# Patient Record
Sex: Female | Born: 1990
Health system: Southern US, Community
[De-identification: ages and names within clinical notes are randomized; demographics above are authoritative.]

## PROBLEM LIST (undated history)

## (undated) ENCOUNTER — Inpatient Hospital Stay (HOSPITAL_COMMUNITY): Payer: Self-pay

## (undated) DIAGNOSIS — R51 Headache: Secondary | ICD-10-CM

## (undated) DIAGNOSIS — E079 Disorder of thyroid, unspecified: Secondary | ICD-10-CM

## (undated) DIAGNOSIS — E0590 Thyrotoxicosis, unspecified without thyrotoxic crisis or storm: Secondary | ICD-10-CM

## (undated) DIAGNOSIS — K219 Gastro-esophageal reflux disease without esophagitis: Secondary | ICD-10-CM

## (undated) DIAGNOSIS — F4310 Post-traumatic stress disorder, unspecified: Secondary | ICD-10-CM

## (undated) DIAGNOSIS — Z8751 Personal history of pre-term labor: Secondary | ICD-10-CM

## (undated) DIAGNOSIS — F902 Attention-deficit hyperactivity disorder, combined type: Secondary | ICD-10-CM

## (undated) DIAGNOSIS — R87629 Unspecified abnormal cytological findings in specimens from vagina: Secondary | ICD-10-CM

## (undated) DIAGNOSIS — F3342 Major depressive disorder, recurrent, in full remission: Secondary | ICD-10-CM

## (undated) DIAGNOSIS — R519 Headache, unspecified: Secondary | ICD-10-CM

## (undated) DIAGNOSIS — R7303 Prediabetes: Secondary | ICD-10-CM

## (undated) DIAGNOSIS — F419 Anxiety disorder, unspecified: Secondary | ICD-10-CM

## (undated) HISTORY — DX: Post-traumatic stress disorder, unspecified: F43.10

## (undated) HISTORY — DX: Gastro-esophageal reflux disease without esophagitis: K21.9

## (undated) HISTORY — DX: Personal history of pre-term labor: Z87.51

## (undated) HISTORY — DX: Attention-deficit hyperactivity disorder, combined type: F90.2

## (undated) HISTORY — DX: Anxiety disorder, unspecified: F41.9

## (undated) HISTORY — DX: Thyrotoxicosis, unspecified without thyrotoxic crisis or storm: E05.90

## (undated) HISTORY — DX: Unspecified abnormal cytological findings in specimens from vagina: R87.629

## (undated) HISTORY — DX: Major depressive disorder, recurrent, in full remission: F33.42

## (undated) HISTORY — DX: Prediabetes: R73.03

---
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
## 2014-09-05 ENCOUNTER — Inpatient Hospital Stay (HOSPITAL_COMMUNITY)
Admission: AD | Admit: 2014-09-05 | Discharge: 2014-09-06 | Disposition: A | Discharge status: Home / Self Care | Payer: BLUE CROSS/BLUE SHIELD | Source: Ambulatory Visit | Attending: Obstetrics & Gynecology | Admitting: Obstetrics & Gynecology

## 2014-09-05 ENCOUNTER — Encounter (HOSPITAL_COMMUNITY): Payer: Self-pay | Admitting: *Deleted

## 2014-09-05 DIAGNOSIS — R1030 Lower abdominal pain, unspecified: Secondary | ICD-10-CM | POA: Insufficient documentation

## 2014-09-05 DIAGNOSIS — O9989 Other specified diseases and conditions complicating pregnancy, childbirth and the puerperium: Secondary | ICD-10-CM | POA: Insufficient documentation

## 2014-09-05 DIAGNOSIS — Z8751 Personal history of pre-term labor: Secondary | ICD-10-CM | POA: Insufficient documentation

## 2014-09-05 DIAGNOSIS — N949 Unspecified condition associated with female genital organs and menstrual cycle: Secondary | ICD-10-CM | POA: Insufficient documentation

## 2014-09-05 DIAGNOSIS — Z3A22 22 weeks gestation of pregnancy: Secondary | ICD-10-CM | POA: Insufficient documentation

## 2014-09-05 NOTE — MAU Note (Signed)
Having some vaginal pain and pressure, took tylenol but it did not seem to help, pain started this morning.  Denies LOF/VB.

## 2014-09-05 NOTE — MAU Provider Note (Signed)
Chief Complaint:  Vaginal Pain  HPI: Paula Huff is a 24 y.o. G2P0101 at 1726w3d who presents to maternity admissions reporting lower abdominal/pelvic pain radiating to vagina.  Pain is bilateral lower abdominal radiating into vagina, sharp, moderate, waxing/waning worsened by walking, improved by sitting/laying down. First noticed about 5 days ago, responsive to tylenol but returns in several hours after dosing.   Denies contractions, leakage of fluid or vaginal bleeding. Good fetal movement.   Pregnancy Course: Uncomplicated, [facility]. History of PTL, SVD at 27 weeks to first child in 2009. Receiving 17-P injections now.  Past Medical History: Non-contributory Past obstetric history: # Outcome Date GA Lbr Len/2nd Weight Sex Delivery Anes PTL Lv  2 Current           1 Preterm 2009    M Vag-Spont   Y   Past Surgical History: No past surgeries Family History: Non-contributory Social History: Denies smoking, EtOH, illicit substances Allergies: No Known Allergies Meds:  Prescriptions prior to admission  Medication Sig Dispense Refill Last Dose  . acetaminophen (TYLENOL) 500 MG tablet Take 1,000 mg by mouth every 6 (six) hours as needed for mild pain.   09/05/2014 at Unknown time  . hydroxyprogesterone caproate (MAKENA) 250 mg/mL OIL injection Inject 250 mg into the muscle once a week. Patient receives injection ever Wednesday.   Past Week at Unknown time  . Prenatal Vit-Fe Fumarate-FA (PRENATAL MULTIVITAMIN) TABS tablet Take 1 tablet by mouth daily at 12 noon.   09/05/2014 at Unknown time   ROS: In addition to HPI: Constitutional: Negative for fever and chills Respiratory: Negative for dyspnea Cardiovascular: Negative for chest pain or palpitations  Gastrointestinal: Negative for vomiting, diarrhea and constipation Genitourinary: Negative for dysuria and urgency Neurological: Negative for headaches and visual changes  Physical Exam: Blood pressure 132/65,  pulse 90, temperature 98.4 F (36.9 C), temperature source Oral, resp. rate 18, height 5' 5.5" (1.664 m), weight 84.913 kg (187 lb 3.2 oz). GENERAL: Well-appearing, well-nourished female in no distress.  HEENT: Normocephalic, atraumatic HEART: Normal rate RESP: Normal effort ABDOMEN: Soft, non-tender, gravid uterus consistent with dates. EXTREMITIES: No edema NEURO: alert and oriented, DTRs 2+  SPECULUM: NEFG, physiologic discharge, no blood, cervix clean Dilation: 1 Effacement (%): 10 Cervical Position: Posterior Exam by:: Dr. Loreta AveAcosta FHR: 165   Imaging:  No results found. ED Course Cervix 1cm dilated and long on check, no signs of PTL. Will obtain U/S to verify cervical length. History consistent with round ligament pain.  ---> Cervical length 3.2cm on U/S  Assessment: Round ligament pain History of preterm labor without current contractions or cervical shortening  Plan: Discharge home Labor precautions and fetal kick counts    Medication List    TAKE these medications        acetaminophen 500 MG tablet  Commonly known as:  TYLENOL  Take 1,000 mg by mouth every 6 (six) hours as needed for mild pain.     MAKENA 250 mg/mL Oil injection  Generic drug:  hydroxyprogesterone caproate  Inject 250 mg into the muscle once a week. Patient receives injection ever Wednesday.     prenatal multivitamin Tabs tablet  Take 1 tablet by mouth daily at 12 noon.       Ryan B. Jarvis NewcomerGrunz, MD, PGY-2 09/06/2014 2:04 AM  OB fellow attestation:  I have seen and examined this patient; I agree with above documentation in the resident's note.   Paula Huff is a 24 y.o. G2P0101 reporting lower  abdominal pain, vaginal pain for >1 month, now worsening for past 5 days.  +FM, denies LOF, VB, contractions, vaginal discharge.  PE: BP 132/65 mmHg  Pulse 90  Temp(Src) 98.4 F (36.9 C) (Oral)  Resp 18  Ht 5' 5.5" (1.664 m)  Wt 187 lb 3.2 oz (84.913 kg)  BMI 30.67 kg/m2 Gen: calm comfortable,  NAD Resp: normal effort, no distress Abd: gravid Dilation: 1 Effacement (%): 10 Cervical Position: Posterior Exam by:: Dr. Loreta Ave Very firm, posterior  ROS, labs, PMH reviewed  Plan: round ligament - preterm labor precautions - continue routine follow up in OB clinic - CL 3.2cm via transvaginal sono, cervix does not feel labored and patient's history not consistent with preterm labor - tylenol prn, belly band prn, warm compresses  Annaclaire Walsworth ROCIO, MD 3:33 AM

## 2014-09-06 ENCOUNTER — Inpatient Hospital Stay (HOSPITAL_COMMUNITY): Payer: BLUE CROSS/BLUE SHIELD

## 2014-09-06 DIAGNOSIS — O9989 Other specified diseases and conditions complicating pregnancy, childbirth and the puerperium: Secondary | ICD-10-CM | POA: Diagnosis not present

## 2014-09-06 DIAGNOSIS — Z8751 Personal history of pre-term labor: Secondary | ICD-10-CM | POA: Insufficient documentation

## 2014-09-06 DIAGNOSIS — R1030 Lower abdominal pain, unspecified: Secondary | ICD-10-CM | POA: Diagnosis not present

## 2014-09-06 DIAGNOSIS — N949 Unspecified condition associated with female genital organs and menstrual cycle: Secondary | ICD-10-CM | POA: Insufficient documentation

## 2014-09-06 DIAGNOSIS — Z3A22 22 weeks gestation of pregnancy: Secondary | ICD-10-CM | POA: Insufficient documentation

## 2014-09-06 NOTE — Discharge Instructions (Signed)
Your pain is likely due to round ligament stretching. Tylenol should be able to take the edge off this pain, but you can also try a supportive belt when walking. You are not in preterm labor and your cervix is 3.2cm long.   If you have regular contractions that are 3-5 minutes apart for at least one hour, bleeding or fluid from your vagina, or you do not feel the baby moving at least 10 times every 2 hours, please call your OB and/or [facility].

## 2015-07-11 ENCOUNTER — Encounter (HOSPITAL_COMMUNITY): Payer: Self-pay | Admitting: *Deleted

## 2016-01-11 MED FILL — MONO-LINYAH 28 TABLET: 0.25-35 | 28 days supply | Qty: 28 | Fill #0

## 2016-02-07 MED FILL — MONO-LINYAH 28 TABLET: 0.25-35 | 28 days supply | Qty: 28 | Fill #1

## 2016-03-06 MED FILL — MONO-LINYAH 28 TABLET: 0.25-35 | 28 days supply | Qty: 28 | Fill #2

## 2016-04-03 MED FILL — LARIN 24 FE 1 MG-20 MCG TAB: 1-20 | 28 days supply | Qty: 28 | Fill #0

## 2016-05-06 MED FILL — LARIN 24 FE 1 MG-20 MCG TAB: 1-20 | 28 days supply | Qty: 28 | Fill #1

## 2016-06-03 MED FILL — LARIN 24 FE 1 MG-20 MCG TAB: 1-20 | 28 days supply | Qty: 28 | Fill #2

## 2016-07-01 MED FILL — LARIN 24 FE 1 MG-20 MCG TAB: 1-20 | 28 days supply | Qty: 28 | Fill #3

## 2016-07-22 ENCOUNTER — Telehealth: Payer: Self-pay | Admitting: *Deleted

## 2016-07-22 NOTE — Telephone Encounter (Signed)
Unable to reach patient at time of Pre-Visit Call.  Left message for patient to return call when available.    

## 2016-07-23 ENCOUNTER — Ambulatory Visit (INDEPENDENT_AMBULATORY_CARE_PROVIDER_SITE_OTHER): Payer: Self-pay | Admitting: Family Medicine

## 2016-07-23 ENCOUNTER — Encounter: Payer: Self-pay | Admitting: Family Medicine

## 2016-07-23 VITALS — BP 114/63 | HR 74 | Temp 98.4°F | Ht 65.5 in | Wt 195.0 lb

## 2016-07-23 DIAGNOSIS — E6609 Other obesity due to excess calories: Secondary | ICD-10-CM

## 2016-07-23 DIAGNOSIS — R002 Palpitations: Secondary | ICD-10-CM

## 2016-07-23 DIAGNOSIS — Z6831 Body mass index (BMI) 31.0-31.9, adult: Secondary | ICD-10-CM

## 2016-07-23 LAB — LIPID PANEL
Cholesterol: 114 mg/dL (ref 0–200)
HDL: 54 mg/dL (ref >39.00)
LDL Cholesterol: 51 mg/dL (ref 0–99)
NONHDL: 60.48
TRIGLYCERIDES: 49 mg/dL (ref 0.0–149.0)
Total CHOL/HDL Ratio: 2
VLDL: 9.8 mg/dL (ref 0.0–40.0)

## 2016-07-23 LAB — COMPREHENSIVE METABOLIC PANEL
ALT: 16 U/L (ref 0–35)
AST: 14 U/L (ref 0–37)
Albumin: 4.4 g/dL (ref 3.5–5.2)
Alkaline Phosphatase: 73 U/L (ref 39–117)
BILIRUBIN TOTAL: 0.5 mg/dL (ref 0.2–1.2)
BUN: 9 mg/dL (ref 6–23)
CALCIUM: 9.6 mg/dL (ref 8.4–10.5)
CO2: 25 mEq/L (ref 19–32)
Chloride: 103 mEq/L (ref 96–112)
Creatinine, Ser: 0.82 mg/dL (ref 0.40–1.20)
GFR: 107.14 mL/min (ref >60.00)
Glucose, Bld: 80 mg/dL (ref 70–99)
Potassium: 3.7 mEq/L (ref 3.5–5.1)
Sodium: 138 mEq/L (ref 135–145)
Total Protein: 7.6 g/dL (ref 6.0–8.3)

## 2016-07-23 LAB — MAGNESIUM: MAGNESIUM: 2.1 mg/dL (ref 1.5–2.5)

## 2016-07-23 NOTE — Progress Notes (Signed)
Pre visit review using our clinic review tool, if applicable. No additional management support is needed unless otherwise documented below in the visit note. 

## 2016-07-23 NOTE — Progress Notes (Signed)
Chief Complaint  Patient presents with  . Establish Care    NO concerns noted.       New Patient Visit SUBJECTIVE: HPI: Paula Huff is an 25 y.o.female who is being seen for establishing care.  Palpitations This has been going on for 2 years. Thinks it may be related to stress. Could be every 1-3 days.  It normally lasts for around 2 min. Nothing she notices makes it better or worse No associated symptoms such as light headedness, dizziness, chest pain or SOB.  No correlation with meals or how much she has drank.  Obesity Diet is unhealthy in general. 5 days a week will walk.  No Known Allergies  Past Medical History:  Diagnosis Date  . Medical history non-contributory    Past Surgical History:  Procedure Laterality Date  . NO PAST SURGERIES     Social History   Social History  . Marital status: Single   Social History Main Topics  . Smoking status: Never Smoker  . Smokeless tobacco: Never Used  . Alcohol use No  . Drug use: No  . Sexual activity: Yes    Birth control/ protection: None    Family History  Problem Relation Age of Onset  . Hypertension Mother   . Diabetes Father   . Hypothyroidism Sister   . Diabetes Brother   . Diabetes Maternal Grandmother      Current Outpatient Prescriptions:  .  Norethindrone Acetate-Ethinyl Estrad-FE (LOESTRIN 24 FE) 1-20 MG-MCG(24) tablet, Take 1 tablet by mouth daily., Disp: , Rfl:   Patient's last menstrual period was 07/22/2016 (exact date).  ROS Cardiovascular: Denies chest pain  Respiratory: Denies dyspnea   OBJECTIVE: BP 114/63 (BP Location: Right Arm, Patient Position: Sitting, Cuff Size: Large)   Pulse 74   Temp 98.4 F (36.9 C) (Oral)   Ht 5' 5.5" (1.664 m)   Wt 195 lb (88.5 kg)   LMP 07/22/2016 (Exact Date)   SpO2 100% Comment: RA  BMI 31.96 kg/m   Constitutional: -  VS reviewed -  Well developed, well nourished, appears stated age -  No apparent distress  Psychiatric: -  Oriented to  person, place, and time -  Memory intact -  Affect and mood normal -  Fluent conversation, good eye contact -  Judgment and insight age appropriate  Eye: -  Conjunctivae clear, no discharge -  Pupils symmetric, round, reactive to light  ENMT: -  Oral mucosa without lesions, tongue and uvula midline    Tonsils not enlarged, no erythema, no exudate, trachea midline    Pharynx moist, no lesions, no erythema  Neck: -  No gross swelling, no palpable masses -  Thyroid midline, not enlarged, mobile, no palpable masses  Cardiovascular: -  RRR, no murmurs -  No LE edema -  No bruits  Respiratory: -  Normal respiratory effort, no accessory muscle use, no retraction -  Breath sounds equal, no wheezes, no ronchi, no crackles  Gastrointestinal: -  Bowel sounds normal -  No tenderness, no distention, no guarding, no masses  Skin: -  No significant lesion on inspection -  Warm and dry to palpation   ASSESSMENT/PLAN: Class 1 obesity due to excess calories without serious comorbidity with body mass index (BMI) of 31.0 to 31.9 in adult - Plan: Comprehensive metabolic panel, Lipid panel  Palpitations - Plan: Magnesium, Comprehensive metabolic panel  Orders as above. Will r/o metabolic causes of palpitations. Could do Holter if it is still bothering her. She does  not wish to be treated for anxiety at this time. Patient should return pending the above workup. The patient voiced understanding and agreement to the plan.   Jilda Rocheicholas Paul AftonWendling, DO 07/23/16  2:28 PM

## 2016-07-31 ENCOUNTER — Telehealth: Payer: Self-pay | Admitting: Family Medicine

## 2016-07-31 NOTE — Telephone Encounter (Signed)
Relation to RU:EAVWpt:self Call back [phone]   Reason for call:  Patient returning call best # [phone] work

## 2016-08-07 MED FILL — LARIN 24 FE 1 MG-20 MCG TAB: 1-20 | 28 days supply | Qty: 28 | Fill #4

## 2016-09-01 NOTE — L&D Delivery Note (Signed)
Delivery Note At 6:29 PM a viable female was delivered via  (Presentation: ROA,). Compound hand presentation, posterior arm delivered with ease and body easily after.  APGAR: 8, 9; weight pending.   Placenta status: spontaneous, intact.  Cord: 3 vessels.  Anesthesia:  epidural Episiotomy:  n/a Lacerations: periurethral abrasions, hemostatic Suture Repair: n/a Est. Blood Loss (mL):    Mom to postpartum.  Baby to Couplet care / Skin to Skin.  Rolm BookbinderCaroline M Timberly Huff CNM 07/16/2017, 6:43 PM  Please schedule this patient for PP visit in: 4 weeks High risk pregnancy complicated by: previous preterm deliveries Delivery mode:  SVD Anticipated Birth Control:  BTL done PP PP Procedures needed: Incision check  Schedule Integrated BH visit: no Provider: Any provider

## 2016-09-03 MED FILL — LARIN 24 FE 1 MG-20 MCG TAB: 1-20 | 28 days supply | Qty: 28 | Fill #5

## 2016-09-11 ENCOUNTER — Encounter: Payer: Self-pay | Admitting: Family Medicine

## 2016-09-12 ENCOUNTER — Other Ambulatory Visit: Payer: Self-pay | Admitting: Family Medicine

## 2016-09-12 DIAGNOSIS — E6609 Other obesity due to excess calories: Secondary | ICD-10-CM

## 2016-09-20 ENCOUNTER — Encounter (INDEPENDENT_AMBULATORY_CARE_PROVIDER_SITE_OTHER): Payer: Self-pay | Admitting: Family Medicine

## 2016-09-26 ENCOUNTER — Ambulatory Visit (INDEPENDENT_AMBULATORY_CARE_PROVIDER_SITE_OTHER): Payer: 59

## 2016-09-26 ENCOUNTER — Ambulatory Visit (INDEPENDENT_AMBULATORY_CARE_PROVIDER_SITE_OTHER): Payer: 59 | Admitting: Family Medicine

## 2016-09-26 ENCOUNTER — Encounter: Payer: Self-pay | Admitting: Family Medicine

## 2016-09-26 VITALS — BP 110/69 | HR 71 | Resp 16 | Wt 196.7 lb

## 2016-09-26 DIAGNOSIS — M79674 Pain in right toe(s): Secondary | ICD-10-CM

## 2016-09-26 DIAGNOSIS — M79671 Pain in right foot: Secondary | ICD-10-CM | POA: Diagnosis not present

## 2016-09-26 NOTE — Progress Notes (Signed)
   Olivia Mackievonia Rivadeneira is a 26 y.o. female who presents to [facility] today for right foot pain. Patient injured her right foot about a week ago. Her daughter ran over her foot on a however board. She notes pain and swelling and tenderness to the lateral foot at the fifth MTP. She notes some pain with ambulation. No fevers or chills nausea vomiting or diarrhea. She feels well otherwise.   Past Medical History:  Diagnosis Date  . Medical history non-contributory    Past Surgical History:  Procedure Laterality Date  . NO PAST SURGERIES     Social History  Substance Use Topics  . Smoking status: Never Smoker  . Smokeless tobacco: Never Used  . Alcohol use No     ROS:  As above   Medications: Current Outpatient Prescriptions  Medication Sig Dispense Refill  . Norethindrone Acetate-Ethinyl Estrad-FE (LOESTRIN 24 FE) 1-20 MG-MCG(24) tablet Take 1 tablet by mouth daily.     No current facility-administered medications for this visit.    No Known Allergies   Exam:  BP 110/69   Pulse 71   Resp 16   Wt 196 lb 11.2 oz (89.2 kg)   BMI 32.23 kg/m  General: Well Developed, well nourished, and in no acute distress.  Neuro/Psych: Alert and oriented x3, extra-ocular muscles intact, able to move all 4 extremities, sensation grossly intact. Skin: Warm and dry, no rashes noted.  Respiratory: Not using accessory muscles, speaking in full sentences, trachea midline.  Cardiovascular: Pulses palpable, no extremity edema. Abdomen: Does not appear distended. MSK: Right foot slightly swollen and tender at the distal fifth metatarsal and MTP. Pain with foot motion. Intact sensation capillary refill.  X-ray right side is unremarkable with no acute fracture seen. Awaiting formal radiology review  No results found for this or any previous visit (from the past 48 hour(s)). No results found.    Assessment and Plan: 26 y.o. female with contusion right  foot no obvious fractures. Plan for relative rest buddy tape as needed ice and topical Aspercreme. Return as needed.    Orders Placed This Encounter  Procedures  . DG Foot Complete Right    Standing Status:   Future    Standing Expiration Date:   11/24/2017    Order Specific Question:   Reason for Exam (SYMPTOM  OR DIAGNOSIS REQUIRED)    Answer:   eval pain rt 5th toe ?fx    Order Specific Question:   Is patient pregnant?    Answer:   No    Order Specific Question:   Preferred imaging location?    Answer:   [facility]     Discussed warning signs or symptoms. Please see discharge instructions. Patient expresses understanding.

## 2016-10-07 ENCOUNTER — Encounter (INDEPENDENT_AMBULATORY_CARE_PROVIDER_SITE_OTHER): Payer: Self-pay | Admitting: Family Medicine

## 2016-10-09 MED FILL — LARIN 24 FE 1 MG-20 MCG TAB: 1-20 | 28 days supply | Qty: 28 | Fill #6

## 2016-10-13 ENCOUNTER — Ambulatory Visit: Payer: 59 | Admitting: Physician Assistant

## 2016-10-14 ENCOUNTER — Ambulatory Visit (INDEPENDENT_AMBULATORY_CARE_PROVIDER_SITE_OTHER): Payer: 59 | Admitting: Family Medicine

## 2016-10-14 ENCOUNTER — Encounter (INDEPENDENT_AMBULATORY_CARE_PROVIDER_SITE_OTHER): Payer: Self-pay | Admitting: Family Medicine

## 2016-10-14 VITALS — BP 124/77 | HR 76 | Temp 98.4°F | Resp 16 | Ht 65.0 in | Wt 191.0 lb

## 2016-10-14 DIAGNOSIS — R7303 Prediabetes: Secondary | ICD-10-CM

## 2016-10-14 DIAGNOSIS — E669 Obesity, unspecified: Secondary | ICD-10-CM | POA: Diagnosis not present

## 2016-10-14 DIAGNOSIS — R5383 Other fatigue: Secondary | ICD-10-CM | POA: Diagnosis not present

## 2016-10-14 DIAGNOSIS — Z6831 Body mass index (BMI) 31.0-31.9, adult: Secondary | ICD-10-CM

## 2016-10-14 DIAGNOSIS — Z9189 Other specified personal risk factors, not elsewhere classified: Secondary | ICD-10-CM

## 2016-10-14 DIAGNOSIS — Z0289 Encounter for other administrative examinations: Secondary | ICD-10-CM

## 2016-10-14 DIAGNOSIS — Z1389 Encounter for screening for other disorder: Secondary | ICD-10-CM

## 2016-10-14 DIAGNOSIS — R0602 Shortness of breath: Secondary | ICD-10-CM

## 2016-10-14 DIAGNOSIS — Z1331 Encounter for screening for depression: Secondary | ICD-10-CM

## 2016-10-14 NOTE — Progress Notes (Signed)
Office: [phone]  /  Fax: [phone]   HPI:   Chief Complaint: Paula  Paula Huff (MR# [id number]) is a 26 y.o. female who presents on 10/14/2016 for Paula evaluation and treatment. Current BMI is Body mass index is 31.78 kg/m.Marland Kitchen Paula Huff has struggled with Paula for years and has been unsuccessful in either losing weight or maintaining long term weight loss. Paula Huff attended our information session and states Paula Huff is currently in the action stage of change and ready to dedicate time achieving and maintaining a healthier weight.  Paula Huff states her family eats meals together Paula Huff thinks her family will eat healthier with  her her desired weight is 150 to 160 Paula Huff started gaining weight around birth of second child her heaviest weight ever was 210 lbs. Paula Huff is a picky eater and doesn't like to eat healthier foods  Paula Huff has significant food cravings issues  Paula Huff snacks frequently in the evenings Paula Huff skips meals frequently Paula Huff is frequently drinking liquids with calories Paula Huff frequently makes poor food choices Paula Huff frequently eats larger portions than normal  Paula Huff has binge eating behaviors Paula Huff struggles with emotional eating    Paula Paula Huff feels her energy is lower than it should be. This has worsened with weight gain and has not worsened recently. Paula Huff denies daytime somnolence and  denies waking up still tired. Patient is at risk for obstructive sleep apnea. Patent has a history of symptoms of morning Paula. Patient generally gets 6 or 7 hours of sleep per night, and states they generally have generally restful sleep. Snoring is present. Apneic episodes are not present. Epworth Sleepiness Score is 3  Paula Huff notes increasing shortness of breath with exercising and seems to be worsening over time with weight gain. Paula Huff notes getting out of breath sooner with activity than Paula Huff used to. This has not gotten worse recently.  Paula Paula Huff has a diagnosis of  prediabetes based on her elevated Hgb A1c in the past, polyphagia and her strong family history of Paula. Paula Huff was informed this puts her at greater risk of developing Paula. Paula Huff is not taking metformin currently and continues to work on diet and exercise to decrease risk of Paula. Paula Huff denies nausea or hypoglycemia.  At risk for Paula Paula Huff is at higher than averagerisk for developing Paula due to her Paula. Paula Huff currently denies polyuria or polydipsia. Paula Huff denies orthopnea.  Paula Screen Paula Huff's Food and Mood (modified PHQ-9) score was  Paula screen PHQ 2/9 10/14/2016  Decreased Interest 2  Down, Depressed, Hopeless 2  PHQ - 2 Score 4  Altered sleeping 3  Tired, decreased energy 2  Change in appetite 3  Feeling bad or failure about yourself  2  Trouble concentrating 3  Moving slowly or fidgety/restless 0  Suicidal thoughts 1  PHQ-9 Score 18    ALLERGIES: No Known Allergies  MEDICATIONS: Current Outpatient Prescriptions on File Prior to Visit  Medication Sig Dispense Refill  . Norethindrone Acetate-Ethinyl Estrad-FE (LOESTRIN 24 FE) 1-20 MG-MCG(24) tablet Take 1 tablet by mouth daily.     No current facility-administered medications on file prior to visit.     PAST MEDICAL HISTORY: Past Medical History:  Diagnosis Date  . Anxiety   . Paula   . GERD (gastroesophageal reflux disease)   . Insomnia   . Medical history non-contributory   . Palpitations   . Prediabetes     PAST SURGICAL HISTORY: Past Surgical History:  Procedure Laterality Date  . NO PAST SURGERIES  SOCIAL HISTORY: Social History  Substance Use Topics  . Smoking status: Never Smoker  . Smokeless tobacco: Never Used  . Alcohol use No    FAMILY HISTORY: Family History  Problem Relation Age of Onset  . Hypertension Mother   . Paula Mother   . Anxiety disorder Mother   . Paula Father   . Paula Father   . Hypothyroidism Sister   . Paula  Brother   . Paula Maternal Grandmother     ROS: Review of Systems  Constitutional: Positive for malaise/Paula.  Respiratory: Positive for shortness of breath (with exercising).   Cardiovascular: Negative for orthopnea.  Gastrointestinal: Negative for nausea.  Genitourinary: Negative for frequency.  Skin:       dryness  Neurological: Positive for headaches.  Endo/Heme/Allergies: Negative for polydipsia.       Polyphagia Negative Hypoglycemia  Psychiatric/Behavioral: Positive for Paula. The patient has insomnia.        Stress    PHYSICAL EXAM: Blood pressure 124/77, pulse 76, temperature 98.4 F (36.9 C), temperature source Oral, resp. rate 16, height 5\' 5"  (1.651 m), weight 191 lb (86.6 kg), last menstrual period 09/03/2016, SpO2 100 %, unknown if currently breastfeeding. Body mass index is 31.78 kg/m. Physical Exam  Constitutional: Paula Huff is oriented to person, place, and time. Paula Huff appears well-developed and well-nourished.  Cardiovascular: Normal rate.   Pulmonary/Chest: Effort normal.  Musculoskeletal: Normal range of motion.  Neurological: Paula Huff is oriented to person, place, and time.  Skin: Skin is warm and dry.  Psychiatric: Paula Huff has a normal mood and affect. Her behavior is normal.  Vitals reviewed.   RECENT LABS AND TESTS: BMET    Component Value Date/Time   NA 138 07/23/2016 1339   K 3.7 07/23/2016 1339   CL 103 07/23/2016 1339   CO2 25 07/23/2016 1339   GLUCOSE 80 07/23/2016 1339   BUN 9 07/23/2016 1339   CREATININE 0.82 07/23/2016 1339   CALCIUM 9.6 07/23/2016 1339   No results found for: HGBA1C No results found for: INSULIN CBC No results found for: WBC, RBC, HGB, HCT, PLT, MCV, MCH, MCHC, RDW, LYMPHSABS, MONOABS, EOSABS, BASOSABS Iron/TIBC/Ferritin/ %Sat No results found for: IRON, TIBC, FERRITIN, IRONPCTSAT Lipid Panel     Component Value Date/Time   CHOL 114 07/23/2016 1339   TRIG 49.0 07/23/2016 1339   HDL 54.00 07/23/2016 1339    CHOLHDL 2 07/23/2016 1339   VLDL 9.8 07/23/2016 1339   LDLCALC 51 07/23/2016 1339   Hepatic Function Panel     Component Value Date/Time   PROT 7.6 07/23/2016 1339   ALBUMIN 4.4 07/23/2016 1339   AST 14 07/23/2016 1339   ALT 16 07/23/2016 1339   ALKPHOS 73 07/23/2016 1339   BILITOT 0.5 07/23/2016 1339   No results found for: TSH  ECG  shows NSR with a rate of 70 BPM INDIRECT CALORIMETER done today shows a VO2 of 266 and a REE of 1849.    ASSESSMENT AND PLAN: Other Paula - Plan: EKG 12-Lead, CBC with Differential/Platelet, Comprehensive metabolic panel, Folate, Lipid Panel With LDL/HDL Ratio, T3, T4, free, TSH, VITAMIN D 25 Hydroxy (Vit-D Deficiency, Fractures), Vitamin B12  Shortness of breath on exertion  Prediabetes - Plan: Hemoglobin A1c, Insulin, random  Paula screening  At risk for Paula mellitus  Class 1 Paula without serious comorbidity with body mass index (BMI) of 31.0 to 31.9 in adult, unspecified Paula type  PLAN:  Paula Huff was informed that her Paula may be related to Paula, Paula  or many other causes. Labs will be ordered, and in the meanwhile Siddhi has agreed to work on diet, exercise and weight loss to help with Paula. Proper sleep hygiene was discussed including the need for 7-8 hours of quality sleep each night. A sleep study was not ordered based on symptoms and Epworth score.  Paula Huff's shortness of breath appears to be Paula related and exercise induced. Paula Huff has agreed to work on weight loss and gradually increase exercise to treat her exercise induced shortness of breath. If Liliah follows our instructions and loses weight without improvement of her shortness of breath, we will plan to refer to pulmonology. We will monitor this condition regularly. Paula Huff agrees to this plan.  Paula Huff will continue to work on weight loss, exercise, and decreasing simple carbohydrates in her diet to help  decrease the risk of Paula. Will check labs and follow in 2 weeks. May need to start metformin to help with polyphagia.   Paula Huff was given extended (at least 15 minutes) Paula prevention counseling today. Paula Huff is 26 y.o. female and has risk factors for Paula including Paula. We discussed intensive lifestyle modifications today with an emphasis on weight loss as well as increasing exercise and decreasing simple carbohydrates in her diet.  Paula Huff had a positive Paula screening. Paula is commonly associated with Paula and often results in emotional eating behaviors. We will monitor this closely and work on CBT to help improve the non-hunger eating patterns. Referral to Psychology may be required if no improvement is seen as Paula Huff continues in our clinic.  Paula Huff is currently in the action stage of change and her goal is to continue with weight loss efforts Paula Huff has agreed to follow the Category 2 plan +100 calories Paula Huff has been instructed to work up to a goal of 150 minutes of combined cardio and strengthening exercise per week for weight loss and overall health benefits. We discussed the following Behavioral Modification Stratagies today: increasing lean protein intake, decreasing simple carbohydrates  and increasing vegetables  Paula Huff has agreed to follow up with our clinic in 2 weeks. Paula Huff was informed of the importance of frequent follow up visits to maximize her success with intensive lifestyle modifications for her multiple health conditions. Paula Huff was informed we would discuss her lab results at her next visit unless there is a critical issue that needs to be addressed sooner. Emberli agreed to keep her next visit at the agreed upon time to discuss these results.  I, Nevada Crane, am acting as scribe for Quillian Quince, MD  I have reviewed the above documentation for accuracy and completeness, and I agree with the above.  -Quillian Quince, MD

## 2016-10-15 LAB — CBC WITH DIFFERENTIAL/PLATELET
Basophils Absolute: 0 10*3/uL (ref 0.0–0.2)
Basos: 0 %
EOS (ABSOLUTE): 0.1 10*3/uL (ref 0.0–0.4)
EOS: 1 %
HEMATOCRIT: 37.7 % (ref 34.0–46.6)
Hemoglobin: 12.8 g/dL (ref 11.1–15.9)
IMMATURE GRANS (ABS): 0 10*3/uL (ref 0.0–0.1)
Immature Granulocytes: 1 %
LYMPHS: 32 %
Lymphocytes Absolute: 1.7 10*3/uL (ref 0.7–3.1)
MCH: 28.1 pg (ref 26.6–33.0)
MCHC: 34 g/dL (ref 31.5–35.7)
MCV: 83 fL (ref 79–97)
Monocytes Absolute: 0.4 10*3/uL (ref 0.1–0.9)
Monocytes: 8 %
NEUTROS ABS: 3.2 10*3/uL (ref 1.4–7.0)
Neutrophils: 58 %
Platelets: 435 10*3/uL — ABNORMAL HIGH (ref 150–379)
RBC: 4.55 x10E6/uL (ref 3.77–5.28)
RDW: 14.5 % (ref 12.3–15.4)
WBC: 5.3 10*3/uL (ref 3.4–10.8)

## 2016-10-15 LAB — COMPREHENSIVE METABOLIC PANEL
ALT: 14 IU/L (ref 0–32)
AST: 12 IU/L (ref 0–40)
Albumin/Globulin Ratio: 1.5 (ref 1.2–2.2)
Albumin: 4.4 g/dL (ref 3.5–5.5)
Alkaline Phosphatase: 77 IU/L (ref 39–117)
BUN/Creatinine Ratio: 13 (ref 9–23)
BUN: 11 mg/dL (ref 6–20)
Bilirubin Total: 0.4 mg/dL (ref 0.0–1.2)
CO2: 22 mmol/L (ref 18–29)
Calcium: 9.7 mg/dL (ref 8.7–10.2)
Chloride: 101 mmol/L (ref 96–106)
Creatinine, Ser: 0.83 mg/dL (ref 0.57–1.00)
GFR calc Af Amer: 112 mL/min/{1.73_m2} (ref >59)
GFR calc non Af Amer: 97 mL/min/{1.73_m2} (ref >59)
GLOBULIN, TOTAL: 2.9 g/dL (ref 1.5–4.5)
Glucose: 76 mg/dL (ref 65–99)
Potassium: 4.7 mmol/L (ref 3.5–5.2)
SODIUM: 140 mmol/L (ref 134–144)
TOTAL PROTEIN: 7.3 g/dL (ref 6.0–8.5)

## 2016-10-15 LAB — HEMOGLOBIN A1C
Est. average glucose Bld gHb Est-mCnc: 105 mg/dL
Hgb A1c MFr Bld: 5.3 % (ref 4.8–5.6)

## 2016-10-15 LAB — VITAMIN D 25 HYDROXY (VIT D DEFICIENCY, FRACTURES): Vit D, 25-Hydroxy: 14.4 ng/mL — ABNORMAL LOW (ref 30.0–100.0)

## 2016-10-15 LAB — LIPID PANEL WITH LDL/HDL RATIO
Cholesterol, Total: 130 mg/dL (ref 100–199)
HDL: 59 mg/dL (ref >39)
LDL CALC: 57 mg/dL (ref 0–99)
LDl/HDL Ratio: 1 ratio units (ref 0.0–3.2)
TRIGLYCERIDES: 72 mg/dL (ref 0–149)
VLDL Cholesterol Cal: 14 mg/dL (ref 5–40)

## 2016-10-15 LAB — T4, FREE: FREE T4: 1.32 ng/dL (ref 0.82–1.77)

## 2016-10-15 LAB — TSH: TSH: 1.69 u[IU]/mL (ref 0.450–4.500)

## 2016-10-15 LAB — T3: T3, Total: 221 ng/dL — ABNORMAL HIGH (ref 71–180)

## 2016-10-15 LAB — VITAMIN B12: VITAMIN B 12: 413 pg/mL (ref 232–1245)

## 2016-10-15 LAB — FOLATE: FOLATE: 9.7 ng/mL (ref >3.0)

## 2016-10-15 LAB — INSULIN, RANDOM: INSULIN: 20.9 u[IU]/mL (ref 2.6–24.9)

## 2016-10-27 ENCOUNTER — Encounter (INDEPENDENT_AMBULATORY_CARE_PROVIDER_SITE_OTHER): Payer: Self-pay | Admitting: Family Medicine

## 2016-10-28 ENCOUNTER — Encounter (INDEPENDENT_AMBULATORY_CARE_PROVIDER_SITE_OTHER): Payer: Self-pay | Admitting: Family Medicine

## 2016-10-28 ENCOUNTER — Ambulatory Visit (INDEPENDENT_AMBULATORY_CARE_PROVIDER_SITE_OTHER): Payer: 59 | Admitting: Family Medicine

## 2016-10-28 VITALS — BP 128/80 | HR 71 | Temp 98.7°F | Resp 16 | Ht 65.0 in | Wt 188.0 lb

## 2016-10-28 DIAGNOSIS — Z6831 Body mass index (BMI) 31.0-31.9, adult: Secondary | ICD-10-CM

## 2016-10-28 DIAGNOSIS — E559 Vitamin D deficiency, unspecified: Secondary | ICD-10-CM | POA: Diagnosis not present

## 2016-10-28 DIAGNOSIS — E669 Obesity, unspecified: Secondary | ICD-10-CM

## 2016-10-28 DIAGNOSIS — E8881 Metabolic syndrome: Secondary | ICD-10-CM | POA: Diagnosis not present

## 2016-10-28 DIAGNOSIS — Z9189 Other specified personal risk factors, not elsewhere classified: Secondary | ICD-10-CM

## 2016-10-28 MED ORDER — METFORMIN HCL 500 MG PO TABS: 500.0000 mg | ORAL_TABLET | Freq: Every day | ORAL | 0 refills | Status: DC

## 2016-10-28 MED ORDER — VITAMIN D (ERGOCALCIFEROL) 1.25 MG (50000 UNIT) PO CAPS: 50000.0000 [IU] | ORAL_CAPSULE | ORAL | 0 refills | Status: DC

## 2016-10-28 MED FILL — VIT D2 1.25 MG (50,000 UNIT: 1.25 MG | 28 days supply | Qty: 4 | Fill #0

## 2016-10-28 MED FILL — metFORMIN HCL 500 MG TABS: 500 | 30 days supply | Qty: 30 | Fill #0

## 2016-10-28 NOTE — Progress Notes (Signed)
Office: [phone]  /  Fax: [phone]   HPI:   Chief Complaint: OBESITY Paula Huff is here to discuss her progress with her obesity treatment plan. She is following her eating plan approximately 60 % of the time and states she is exercising 0 minutes 0 times per week. Paula Huff got bored with her plan. She noted hunger when she followed the plan but had significant cravings. She would like more options for lunch especially.  Her weight is 188 lb (85.3 kg) today and has had a weight loss of 3 pounds over a period of 2 weeks since her last visit. She has lost 3 lbs since starting treatment with [place].  Vitamin D deficiency Paula Huff has a new diagnosis of vitamin D deficiency. She is not currently taking vit D. She admits fatigue and denies nausea, vomiting or muscle weakness.  Insulin Resistance Paula Huff has a new diagnosis of insulin resistance based on her very elevated fasting insulin level >5. Although Paula Huff's  Hgb A1c and blood glucose readings are within normal limits, insulin resistance puts her at greater risk of metabolic syndrome and diabetes. She is not taking metformin currently and continues to work on diet and exercise to decrease risk of diabetes.  At risk for diabetes Paula Huff is at higher than average risk for developing diabetes due to her obesity. She currently denies polyuria or polydipsia.   Wt Readings from Last 500 Encounters:  10/28/16 188 lb (85.3 kg)  10/14/16 191 lb (86.6 kg)  09/26/16 196 lb 11.2 oz (89.2 kg)  07/23/16 195 lb (88.5 kg)  09/05/14 187 lb 3.2 oz (84.9 kg)     ALLERGIES: No Known Allergies  MEDICATIONS: Current Outpatient Prescriptions on File Prior to Visit  Medication Sig Dispense Refill  . Norethindrone Acetate-Ethinyl Estrad-FE (LOESTRIN 24 FE) 1-20 MG-MCG(24) tablet Take 1 tablet by mouth daily.     No current facility-administered medications on file prior to visit.     PAST MEDICAL HISTORY: Past Medical History:  Diagnosis Date  .  Anxiety   . Depression   . GERD (gastroesophageal reflux disease)   . Insomnia   . Medical history non-contributory   . Palpitations   . Prediabetes     PAST SURGICAL HISTORY: Past Surgical History:  Procedure Laterality Date  . NO PAST SURGERIES      SOCIAL HISTORY: Social History  Substance Use Topics  . Smoking status: Never Smoker  . Smokeless tobacco: Never Used  . Alcohol use No    FAMILY HISTORY: Family History  Problem Relation Age of Onset  . Hypertension Mother   . Depression Mother   . Anxiety disorder Mother   . Diabetes Father   . Obesity Father   . Hypothyroidism Sister   . Diabetes Brother   . Diabetes Maternal Grandmother     ROS: Review of Systems  Constitutional: Positive for malaise/fatigue and weight loss.  Gastrointestinal: Negative for nausea and vomiting.  Genitourinary: Negative for frequency.  Musculoskeletal:       Negative muscle weakness  Endo/Heme/Allergies: Negative for polydipsia.       Polyphagia    PHYSICAL EXAM: Blood pressure 128/80, pulse 71, temperature 98.7 F (37.1 C), temperature source Oral, resp. rate 16, height 5\' 5"  (1.651 m), weight 188 lb (85.3 kg), last menstrual period 10/27/2016, SpO2 100 %, unknown if currently breastfeeding. Body mass index is 31.28 kg/m. Physical Exam  Constitutional: She is oriented to person, place, and time. She appears well-developed and well-nourished.  Cardiovascular: Normal rate.  Pulmonary/Chest: Effort normal.  Musculoskeletal: Normal range of motion.  Neurological: She is oriented to person, place, and time.  Skin: Skin is warm and dry.  Psychiatric: She has a normal mood and affect. Her behavior is normal.  Vitals reviewed.   RECENT LABS AND TESTS: BMET    Component Value Date/Time   NA 140 10/14/2016 1040   K 4.7 10/14/2016 1040   CL 101 10/14/2016 1040   CO2 22 10/14/2016 1040   GLUCOSE 76 10/14/2016 1040   GLUCOSE 80 07/23/2016 1339   BUN 11 10/14/2016 1040    CREATININE 0.83 10/14/2016 1040   CALCIUM 9.7 10/14/2016 1040   GFRNONAA 97 10/14/2016 1040   GFRAA 112 10/14/2016 1040   Lab Results  Component Value Date   HGBA1C 5.3 10/14/2016   Lab Results  Component Value Date   INSULIN 20.9 10/14/2016   CBC    Component Value Date/Time   WBC 5.3 10/14/2016 1040   RBC 4.55 10/14/2016 1040   HCT 37.7 10/14/2016 1040   PLT 435 (H) 10/14/2016 1040   MCV 83 10/14/2016 1040   MCH 28.1 10/14/2016 1040   MCHC 34.0 10/14/2016 1040   RDW 14.5 10/14/2016 1040   LYMPHSABS 1.7 10/14/2016 1040   EOSABS 0.1 10/14/2016 1040   BASOSABS 0.0 10/14/2016 1040   Iron/TIBC/Ferritin/ %Sat No results found for: IRON, TIBC, FERRITIN, IRONPCTSAT Lipid Panel     Component Value Date/Time   CHOL 130 10/14/2016 1040   TRIG 72 10/14/2016 1040   HDL 59 10/14/2016 1040   CHOLHDL 2 07/23/2016 1339   VLDL 9.8 07/23/2016 1339   LDLCALC 57 10/14/2016 1040   Hepatic Function Panel     Component Value Date/Time   PROT 7.3 10/14/2016 1040   ALBUMIN 4.4 10/14/2016 1040   AST 12 10/14/2016 1040   ALT 14 10/14/2016 1040   ALKPHOS 77 10/14/2016 1040   BILITOT 0.4 10/14/2016 1040      Component Value Date/Time   TSH 1.690 10/14/2016 1040    ASSESSMENT AND PLAN: Vitamin D deficiency - Plan: Vitamin D, Ergocalciferol, (DRISDOL) 50000 units CAPS capsule  Insulin resistance - Plan: metFORMIN (GLUCOPHAGE) 500 MG tablet  At risk for diabetes mellitus  Class 1 obesity without serious comorbidity with body mass index (BMI) of 31.0 to 31.9 in adult, unspecified obesity type  PLAN:  Vitamin D Deficiency Paula Huff was informed that low vitamin D levels contributes to fatigue and are associated with obesity, breast, and colon cancer. She agrees to start to take prescription Vit D @50 ,000 IU every week #4 with no refills and will follow up for routine testing of vitamin D, at least 2-3 times per year. She was informed of the risk of over-replacement of vitamin D and  agrees to not increase her dose unless he discusses this with [place] first.  Insulin Resistance Paula Huff will continue to work on weight loss, exercise, and decreasing simple carbohydrates in her diet to help decrease the risk of diabetes. We dicussed metformin including benefits and risks. She was informed that eating too many simple carbohydrates or too many calories at one sitting increases the likelihood of GI side effects. Paula Huff requested metformin for now and prescription was written today for Metformin 500 mg every morning #30 with no refills. Paula Huff agreed to take her birth control pills reliably and to follow up with [place] as directed to monitor her progress.  Diabetes risk counselling Paula Huff was given extended (at least 30 minutes) diabetes prevention counseling today. She is 27  y.o. female and has risk factors for diabetes including obesity. We discussed intensive lifestyle modifications today with an emphasis on weight loss as well as increasing exercise and decreasing simple carbohydrates in her diet.  Obesity Paula Huff is currently in the action stage of change. As such, her goal is to continue with weight loss efforts She has agreed to keep a food journal with 350 to 500 calories and 30 grams of protein at lunch daily and follow the Category 2 plan Paula Huff has been instructed to work up to a goal of 150 minutes of combined cardio and strengthening exercise per week for weight loss and overall health benefits. We discussed the following Behavioral Modification Stratagies today: increasing lean protein intake, decreasing simple carbohydrates , increasing vegetables and increasing lower sugar fruits  Rivky has agreed to follow up with our clinic in 2 weeks. She was informed of the importance of frequent follow up visits to maximize her success with intensive lifestyle modifications for her multiple health conditions.  I, [place] CraneJoanne Murray, am acting as scribe for Quillian Quincearen Ewell Benassi, MD  I have reviewed  the above documentation for accuracy and completeness, and I agree with the above. -Quillian Quincearen Koralee Wedeking, MD

## 2016-10-31 MED FILL — LARIN 24 FE 1 MG-20 MCG TAB: 1-20 | 28 days supply | Qty: 28 | Fill #7

## 2016-11-04 ENCOUNTER — Ambulatory Visit (INDEPENDENT_AMBULATORY_CARE_PROVIDER_SITE_OTHER): Payer: 59 | Admitting: Family Medicine

## 2016-11-10 ENCOUNTER — Ambulatory Visit (INDEPENDENT_AMBULATORY_CARE_PROVIDER_SITE_OTHER): Payer: 59 | Admitting: Family Medicine

## 2016-11-10 ENCOUNTER — Encounter (INDEPENDENT_AMBULATORY_CARE_PROVIDER_SITE_OTHER): Payer: Self-pay

## 2016-11-12 ENCOUNTER — Ambulatory Visit (INDEPENDENT_AMBULATORY_CARE_PROVIDER_SITE_OTHER): Payer: 59 | Admitting: Family Medicine

## 2016-11-12 VITALS — BP 114/76 | HR 80 | Temp 99.0°F | Ht 65.0 in | Wt 182.0 lb

## 2016-11-12 DIAGNOSIS — E559 Vitamin D deficiency, unspecified: Secondary | ICD-10-CM | POA: Diagnosis not present

## 2016-11-12 DIAGNOSIS — R7303 Prediabetes: Secondary | ICD-10-CM | POA: Diagnosis not present

## 2016-11-12 DIAGNOSIS — E669 Obesity, unspecified: Secondary | ICD-10-CM

## 2016-11-12 DIAGNOSIS — Z6830 Body mass index (BMI) 30.0-30.9, adult: Secondary | ICD-10-CM

## 2016-11-12 DIAGNOSIS — Z9189 Other specified personal risk factors, not elsewhere classified: Secondary | ICD-10-CM

## 2016-11-12 MED ORDER — VITAMIN D (ERGOCALCIFEROL) 1.25 MG (50000 UNIT) PO CAPS: 50000.0000 [IU] | ORAL_CAPSULE | ORAL | 0 refills | Status: DC

## 2016-11-12 NOTE — Progress Notes (Signed)
Office: [phone]  /  Fax: [phone]   HPI:   Chief Complaint: OBESITY Paula Huff is here to discuss her progress with her obesity treatment plan. She is following her eating plan approximately 95 % of the time and states she is exercising 0 minutes 0 times per week. Paula Huff is doing well with weight loss and is doing well with meal planning but often not eating all her food. She would like more options for dinner. Her weight is 182 lb (82.6 kg) today and has had a weight loss of 6 pounds over a period of 2 weeks since her last visit. She has lost 9 lbs since starting treatment with [place].  Pre-Diabetes Paula Huff has a diagnosis of prediabetes based on her elevated Hgb A1c and was informed this puts her at greater risk of developing diabetes. She started taking metformin but had significant nausea, so she stopped after 3 days. She still notes polyphagia. She continues to work on diet and exercise to decrease risk of diabetes. She denies nausea or hypoglycemia.  At risk for diabetes Paula Huff is at higher than average risk for developing diabetes due to her obesity. She currently denies polyuria or polydipsia.  Vitamin D deficiency Paula Huff has a diagnosis of vitamin D deficiency. She is currently taking vit D, not yet at goal. She notes fatigue slightly improved and denies nausea, vomiting or muscle weakness.  Wt Readings from Last 500 Encounters:  11/12/16 182 lb (82.6 kg)  10/28/16 188 lb (85.3 kg)  10/14/16 191 lb (86.6 kg)  09/26/16 196 lb 11.2 oz (89.2 kg)  07/23/16 195 lb (88.5 kg)  09/05/14 187 lb 3.2 oz (84.9 kg)     ALLERGIES: No Known Allergies  MEDICATIONS: Current Outpatient Prescriptions on File Prior to Visit  Medication Sig Dispense Refill  . metFORMIN (GLUCOPHAGE) 500 MG tablet Take 1 tablet (500 mg total) by mouth daily with breakfast. (Patient taking differently: Take 500 mg by mouth daily with breakfast. Start 1/2 tab daily on 11/12/16) 30 tablet 0  . Norethindrone  Acetate-Ethinyl Estrad-FE (LOESTRIN 24 FE) 1-20 MG-MCG(24) tablet Take 1 tablet by mouth daily.     No current facility-administered medications on file prior to visit.     PAST MEDICAL HISTORY: Past Medical History:  Diagnosis Date  . Anxiety   . Depression   . GERD (gastroesophageal reflux disease)   . Insomnia   . Medical history non-contributory   . Palpitations   . Prediabetes     PAST SURGICAL HISTORY: Past Surgical History:  Procedure Laterality Date  . NO PAST SURGERIES      SOCIAL HISTORY: Social History  Substance Use Topics  . Smoking status: Never Smoker  . Smokeless tobacco: Never Used  . Alcohol use No    FAMILY HISTORY: Family History  Problem Relation Age of Onset  . Hypertension Mother   . Depression Mother   . Anxiety disorder Mother   . Diabetes Father   . Obesity Father   . Hypothyroidism Sister   . Diabetes Brother   . Diabetes Maternal Grandmother     ROS: Review of Systems  Constitutional: Positive for malaise/fatigue and weight loss.  Gastrointestinal: Negative for nausea and vomiting.  Genitourinary: Negative for frequency.  Musculoskeletal:       Negative muscle weakness  Endo/Heme/Allergies: Negative for polydipsia.       Polyphagia Negative hypoglycemia    PHYSICAL EXAM: Blood pressure 114/76, pulse 80, temperature 99 F (37.2 C), temperature source Oral, height 5\' 5"  (1.651 m),  weight 182 lb (82.6 kg), last menstrual period 10/27/2016, SpO2 99 %, unknown if currently breastfeeding. Body mass index is 30.29 kg/m. Physical Exam  RECENT LABS AND TESTS: BMET    Component Value Date/Time   NA 140 10/14/2016 1040   K 4.7 10/14/2016 1040   CL 101 10/14/2016 1040   CO2 22 10/14/2016 1040   GLUCOSE 76 10/14/2016 1040   GLUCOSE 80 07/23/2016 1339   BUN 11 10/14/2016 1040   CREATININE 0.83 10/14/2016 1040   CALCIUM 9.7 10/14/2016 1040   GFRNONAA 97 10/14/2016 1040   GFRAA 112 10/14/2016 1040   Lab Results  Component  Value Date   HGBA1C 5.3 10/14/2016   Lab Results  Component Value Date   INSULIN 20.9 10/14/2016   CBC    Component Value Date/Time   WBC 5.3 10/14/2016 1040   RBC 4.55 10/14/2016 1040   HCT 37.7 10/14/2016 1040   PLT 435 (H) 10/14/2016 1040   MCV 83 10/14/2016 1040   MCH 28.1 10/14/2016 1040   MCHC 34.0 10/14/2016 1040   RDW 14.5 10/14/2016 1040   LYMPHSABS 1.7 10/14/2016 1040   EOSABS 0.1 10/14/2016 1040   BASOSABS 0.0 10/14/2016 1040   Iron/TIBC/Ferritin/ %Sat No results found for: IRON, TIBC, FERRITIN, IRONPCTSAT Lipid Panel     Component Value Date/Time   CHOL 130 10/14/2016 1040   TRIG 72 10/14/2016 1040   HDL 59 10/14/2016 1040   CHOLHDL 2 07/23/2016 1339   VLDL 9.8 07/23/2016 1339   LDLCALC 57 10/14/2016 1040   Hepatic Function Panel     Component Value Date/Time   PROT 7.3 10/14/2016 1040   ALBUMIN 4.4 10/14/2016 1040   AST 12 10/14/2016 1040   ALT 14 10/14/2016 1040   ALKPHOS 77 10/14/2016 1040   BILITOT 0.4 10/14/2016 1040      Component Value Date/Time   TSH 1.690 10/14/2016 1040    ASSESSMENT AND PLAN: Prediabetes  Vitamin D deficiency - Plan: Vitamin D, Ergocalciferol, (DRISDOL) 50000 units CAPS capsule  At risk for diabetes mellitus  Class 1 obesity without serious comorbidity with body mass index (BMI) of 30.0 to 30.9 in adult, unspecified obesity type  PLAN:  Pre-Diabetes Paula Huff will continue to work on weight loss, exercise, and decreasing simple carbohydrates in her diet to help decrease the risk of diabetes. We dicussed metformin including benefits and risks. She was informed that eating too many simple carbohydrates or too many calories at one sitting increases the likelihood of GI side effects. Paula Huff agrees to restart Metformin at 1/2 dose in morning after her meal. Paula Huff agreed to follow up with [place] in 2 weeks to monitor her progress.  Diabetes risk counselling Paula Huff was given extended (at least 15 minutes) diabetes prevention  counseling today. She is 26 y.o. female and has risk factors for diabetes including obesity. We discussed intensive lifestyle modifications today with an emphasis on weight loss as well as increasing exercise and decreasing simple carbohydrates in her diet.  Vitamin D Deficiency Paula Huff was informed that low vitamin D levels contributes to fatigue and are associated with obesity, breast, and colon cancer. She agrees to continue to take prescription Vit D @50 ,000 IU every week, we will refill for 1 month and will follow up for routine testing of vitamin D, at least 2-3 times per year. She was informed of the risk of over-replacement of vitamin D and agrees to not increase her dose unless he discusses this with [place] first.  Obesity Paula Huff is currently in  the action stage of change. As such, her goal is to continue with weight loss efforts She has agreed to keep a food journal with 350 to 500 calories and 30 grams of protein daily at supper and follow the Category 2 plan Paula Huff has been instructed to work up to a goal of 150 minutes of combined cardio and strengthening exercise per week for weight loss and overall health benefits. We discussed the following Behavioral Modification Stratagies today: increasing lean protein intake and work on meal planning and easy cooking plans  Paula Huff has agreed to follow up with our clinic in 2 weeks. She was informed of the importance of frequent follow up visits to maximize her success with intensive lifestyle modifications for her multiple health conditions.  I, [place] CraneJoanne Murray, am acting as scribe for Quillian Quincearen Nariah Morgano, MD  I have reviewed the above documentation for accuracy and completeness, and I agree with the above. -Quillian Quincearen Kambrey Hagger, MD

## 2016-11-26 ENCOUNTER — Ambulatory Visit (INDEPENDENT_AMBULATORY_CARE_PROVIDER_SITE_OTHER): Payer: 59 | Admitting: Family Medicine

## 2016-11-26 VITALS — BP 137/74 | HR 90 | Temp 98.6°F | Ht 65.0 in | Wt 184.0 lb

## 2016-11-26 DIAGNOSIS — E669 Obesity, unspecified: Secondary | ICD-10-CM

## 2016-11-26 DIAGNOSIS — G4709 Other insomnia: Secondary | ICD-10-CM | POA: Diagnosis not present

## 2016-11-26 DIAGNOSIS — Z6830 Body mass index (BMI) 30.0-30.9, adult: Secondary | ICD-10-CM | POA: Diagnosis not present

## 2016-11-26 DIAGNOSIS — E559 Vitamin D deficiency, unspecified: Secondary | ICD-10-CM | POA: Diagnosis not present

## 2016-11-26 MED ORDER — VITAMIN D (ERGOCALCIFEROL) 1.25 MG (50000 UNIT) PO CAPS: 50000.0000 [IU] | ORAL_CAPSULE | ORAL | 0 refills | Status: DC

## 2016-11-26 MED ORDER — MELATONIN 10 MG PO TABS: 1.0000 | ORAL_TABLET | Freq: Every evening | ORAL | 0 refills | Status: DC

## 2016-11-26 NOTE — Progress Notes (Signed)
Office: [phone]  /  Fax: [phone]   HPI:   Chief Complaint: OBESITY Paula Huff is here to discuss her progress with her obesity treatment plan. She is following her eating plan approximately 10 % of the time and states she is exercising 15 to 20 minutes 2 to 3 times per week. Paula Huff off track over the last 2 weeks, has had increased work and family stress and not able to concentrate on weight loss. She states she is ready to try to journal again. Her weight is 184 lb (83.5 kg) today and has had a weight gain of 2 lbs over a period of 2 weeks since her last visit. She has lost 7 lbs since starting treatment with [place].  Vitamin D deficiency Paula Huff has a diagnosis of vitamin D deficiency. She is currently stable on vit D, not yet at goal and denies nausea, vomiting or muscle weakness.  Insomnia Paula Huff able to fall asleep okay but wakes frequently and cannot go back to sleep quickly.   Wt Readings from Last 500 Encounters:  11/26/16 184 lb (83.5 kg)  11/12/16 182 lb (82.6 kg)  10/28/16 188 lb (85.3 kg)  10/14/16 191 lb (86.6 kg)  09/26/16 196 lb 11.2 oz (89.2 kg)  07/23/16 195 lb (88.5 kg)  09/05/14 187 lb 3.2 oz (84.9 kg)     ALLERGIES: No Known Allergies  MEDICATIONS: Current Outpatient Prescriptions on File Prior to Visit  Medication Sig Dispense Refill   metFORMIN (GLUCOPHAGE) 500 MG tablet Take 1 tablet (500 mg total) by mouth daily with breakfast. (Patient taking differently: Take 500 mg by mouth daily with breakfast. Start 1/2 tab daily on 11/12/16) 30 tablet 0   Norethindrone Acetate-Ethinyl Estrad-FE (LOESTRIN 24 FE) 1-20 MG-MCG(24) tablet Take 1 tablet by mouth daily.     No current facility-administered medications on file prior to visit.     PAST MEDICAL HISTORY: Past Medical History:  Diagnosis Date   Anxiety    Depression    GERD (gastroesophageal reflux disease)    Insomnia    Medical history non-contributory    Palpitations    Prediabetes       PAST SURGICAL HISTORY: Past Surgical History:  Procedure Laterality Date   NO PAST SURGERIES      SOCIAL HISTORY: Social History  Substance Use Topics   Smoking status: Never Smoker   Smokeless tobacco: Never Used   Alcohol use No    FAMILY HISTORY: Family History  Problem Relation Age of Onset   Hypertension Mother    Depression Mother    Anxiety disorder Mother    Diabetes Father    Obesity Father    Hypothyroidism Sister    Diabetes Brother    Diabetes Maternal Grandmother     ROS: Review of Systems  Constitutional: Negative for weight loss.  Gastrointestinal: Negative for nausea and vomiting.  Musculoskeletal:       Negative muscle weakness  Psychiatric/Behavioral: The patient has insomnia.     PHYSICAL EXAM: Blood pressure 137/74, pulse 90, temperature 98.6 F (37 C), temperature source Oral, height 5\' 5"  (1.651 m), weight 184 lb (83.5 kg), last menstrual period 10/27/2016, SpO2 99 %, unknown if currently breastfeeding. Body mass index is 30.62 kg/m. Physical Exam  Constitutional: She is oriented to person, place, and time. She appears well-developed and well-nourished.  Cardiovascular: Normal rate.   Pulmonary/Chest: Effort normal.  Musculoskeletal: Normal range of motion.  Neurological: She is oriented to person, place, and time.  Skin: Skin is warm and  dry.  Psychiatric: She has a normal mood and affect. Her behavior is normal.  Vitals reviewed.   RECENT LABS AND TESTS: BMET    Component Value Date/Time   NA 140 10/14/2016 1040   K 4.7 10/14/2016 1040   CL 101 10/14/2016 1040   CO2 22 10/14/2016 1040   GLUCOSE 76 10/14/2016 1040   GLUCOSE 80 07/23/2016 1339   BUN 11 10/14/2016 1040   CREATININE 0.83 10/14/2016 1040   CALCIUM 9.7 10/14/2016 1040   GFRNONAA 97 10/14/2016 1040   GFRAA 112 10/14/2016 1040   Lab Results  Component Value Date   HGBA1C 5.3 10/14/2016   Lab Results  Component Value Date   INSULIN 20.9  10/14/2016   CBC    Component Value Date/Time   WBC 5.3 10/14/2016 1040   RBC 4.55 10/14/2016 1040   HCT 37.7 10/14/2016 1040   PLT 435 (H) 10/14/2016 1040   MCV 83 10/14/2016 1040   MCH 28.1 10/14/2016 1040   MCHC 34.0 10/14/2016 1040   RDW 14.5 10/14/2016 1040   LYMPHSABS 1.7 10/14/2016 1040   EOSABS 0.1 10/14/2016 1040   BASOSABS 0.0 10/14/2016 1040   Iron/TIBC/Ferritin/ %Sat No results found for: IRON, TIBC, FERRITIN, IRONPCTSAT Lipid Panel     Component Value Date/Time   CHOL 130 10/14/2016 1040   TRIG 72 10/14/2016 1040   HDL 59 10/14/2016 1040   CHOLHDL 2 07/23/2016 1339   VLDL 9.8 07/23/2016 1339   LDLCALC 57 10/14/2016 1040   Hepatic Function Panel     Component Value Date/Time   PROT 7.3 10/14/2016 1040   ALBUMIN 4.4 10/14/2016 1040   AST 12 10/14/2016 1040   ALT 14 10/14/2016 1040   ALKPHOS 77 10/14/2016 1040   BILITOT 0.4 10/14/2016 1040      Component Value Date/Time   TSH 1.690 10/14/2016 1040    ASSESSMENT AND PLAN: Other insomnia - Plan: Melatonin 10 MG TABS  Vitamin D deficiency - Plan: Vitamin D, Ergocalciferol, (DRISDOL) 50000 units CAPS capsule  Class 1 obesity without serious comorbidity with body mass index (BMI) of 30.0 to 30.9 in adult, unspecified obesity type  PLAN:  Vitamin D Deficiency Paula Huff was informed that low vitamin D levels contributes to fatigue and are associated with obesity, breast, and colon cancer. She agrees to continue to take prescription Vit D @50 ,000 IU every week, we will refill for 1 month and will follow up for routine testing of vitamin D, at least 2-3 times per year. She was informed of the risk of over-replacement of vitamin D and agrees to not increase her dose unless he discusses this with [place] first.  Insomnia Paula Huff agrees to start OTC Melatonin 10 mg qdinner and will follow up with our clinic in 2 weeks.   Obesity Paula Huff is currently in the action stage of change. As such, her goal is to continue with  weight loss efforts She has agreed to keep a food journal with 350 to 500 calories and 30 grams of protein at lunch daily and follow the Category 2 plan Paula Huff has been instructed to work up to a goal of 150 minutes of combined cardio and strengthening exercise per week for weight loss and overall health benefits. We discussed the following Behavioral Modification Stratagies today: increasing protein intake, increasing lower sugar fruits, decrease eating out and decrease snacking   Paula Huff has agreed to follow up with our clinic in 2 weeks. She was informed of the importance of frequent follow up visits to  maximize her success with intensive lifestyle modifications for her multiple health conditions.  I, [place] CraneJoanne Murray, am acting as scribe for Quillian Quincearen Beasley, MD  I have reviewed the above documentation for accuracy and completeness, and I agree with the above. -Quillian Quincearen Beasley, MD

## 2016-12-08 ENCOUNTER — Encounter: Payer: Self-pay | Admitting: Family Medicine

## 2016-12-08 ENCOUNTER — Ambulatory Visit (INDEPENDENT_AMBULATORY_CARE_PROVIDER_SITE_OTHER): Payer: 59 | Admitting: Family Medicine

## 2016-12-08 VITALS — BP 100/58 | HR 84 | Temp 98.3°F | Ht 65.0 in | Wt 188.8 lb

## 2016-12-08 DIAGNOSIS — J301 Allergic rhinitis due to pollen: Secondary | ICD-10-CM | POA: Diagnosis not present

## 2016-12-08 DIAGNOSIS — J208 Acute bronchitis due to other specified organisms: Secondary | ICD-10-CM

## 2016-12-08 DIAGNOSIS — B9689 Other specified bacterial agents as the cause of diseases classified elsewhere: Secondary | ICD-10-CM | POA: Diagnosis not present

## 2016-12-08 MED ORDER — AZITHROMYCIN 250 MG PO TABS: ORAL_TABLET | ORAL | 0 refills | Status: DC

## 2016-12-08 MED ORDER — FEXOFENADINE HCL 180 MG PO TABS: 180.0000 mg | ORAL_TABLET | Freq: Every day | ORAL | 2 refills | Status: DC

## 2016-12-08 MED ORDER — FLUTICASONE PROPIONATE 50 MCG/ACT NA SUSP: 2.0000 | Freq: Every day | NASAL | 2 refills | Status: DC

## 2016-12-08 MED FILL — AZITHROMYCIN 250 MG TABLET: 250 | 5 days supply | Qty: 6 | Fill #0

## 2016-12-08 MED FILL — SM FEXOFENADINE HCL 180 MG: 180 | 30 days supply | Qty: 30 | Fill #0

## 2016-12-08 MED FILL — FLUTICASONE PROP 50 MCG SPR: 50 | 90 days supply | Qty: 48 | Fill #0

## 2016-12-08 NOTE — Patient Instructions (Addendum)
Claritin (loratadine), Allegra (fexofenadine), Zyrtec (cetirizine); these are listed in order from weakest to strongest. Generic, and therefore cheaper, options are in the parentheses.   Flonase (fluticasone); nasal spray that is over the counter. 2 sprays each nostril, once daily. Aim towards the same side eye when you spray. May want to hold off until you see your OB provider.   There are available OTC, and the generic versions, which may be cheaper, are in parentheses. Show this to a pharmacist if you have trouble finding any of these items.  Continue to push fluids, practice good hand hygiene, and cover your mouth if you cough.  If you start having fevers, shaking or shortness of breath, seek immediate care.

## 2016-12-08 NOTE — Progress Notes (Signed)
Chief Complaint  Patient presents with  . Cough    product-yellow w/blood and dry-x 4 weeks-got worse on Friday    Paula Huff here for URI complaints.  Duration: 4 weeks  Associated symptoms: sinus congestion, sore throat at night only, chest pain and cough Denies: sinus pain, itchy watery eyes, ear pain, ear drainage and shortness of breath Treatment to date: Cough drops Sick contacts: No  ROS:  Const: Denies fevers HEENT: As noted in HPI Lungs: No SOB  Past Medical History:  Diagnosis Date  . Anxiety   . Depression   . GERD (gastroesophageal reflux disease)   . Insomnia   . Medical history non-contributory   . Palpitations   . Prediabetes    Family History  Problem Relation Age of Onset  . Hypertension Mother   . Depression Mother   . Anxiety disorder Mother   . Diabetes Father   . Obesity Father   . Hypothyroidism Sister   . Diabetes Brother   . Diabetes Maternal Grandmother     BP (!) 100/58 (BP Location: Left Arm, Patient Position: Sitting, Cuff Size: Large)   Pulse 84   Temp 98.3 F (36.8 C) (Oral)   Ht  (1.651 m)   Wt 188 lb 12.8 oz (85.6 kg)   SpO2 99%   BMI 31.42 kg/m  General: Awake, alert, appears stated age HEENT: AT, [place], ears patent b/l and TM's neg, nares patent w/o discharge, R turbinate swollen, no bogginess, pharynx pink and without exudates, MMM Neck: No masses or asymmetry Heart: RRR, no murmurs, no bruits Lungs: CTAB, no accessory muscle use Psych: Age appropriate judgment and insight, normal mood and affect  Acute bacterial bronchitis - Plan: azithromycin (ZITHROMAX) 250 MG tablet  Seasonal allergic rhinitis due to pollen, unspecified chronicity - Plan: fexofenadine (ALLEGRA ALLERGY) 180 MG tablet, fluticasone (FLONASE) 50 MCG/ACT nasal spray  Orders as above. Sounds like she may be experiencing allergies as well causing PND. Will give abx if allergy tx is not helpful. Discussed level C pregnancy safety of Flonase. Discussed  while it is likely safe, may want to wait until she sees her OB before using. Continue to push fluids, practice good hand hygiene, cover mouth when coughing. F/u prn. If starting to experience fevers, shaking, or shortness of breath, seek immediate care. Pt voiced understanding and agreement to the plan.  Paula Roche Disautel, DO 12/08/16 11:52 AM

## 2016-12-08 NOTE — Progress Notes (Signed)
Pre visit review using our clinic review tool, if applicable. No additional management support is needed unless otherwise documented below in the visit note. 

## 2016-12-09 ENCOUNTER — Encounter: Payer: Self-pay | Admitting: *Deleted

## 2016-12-09 DIAGNOSIS — O0990 Supervision of high risk pregnancy, unspecified, unspecified trimester: Secondary | ICD-10-CM | POA: Insufficient documentation

## 2016-12-11 ENCOUNTER — Encounter: Payer: Self-pay | Admitting: Advanced Practice Midwife

## 2016-12-11 DIAGNOSIS — O09219 Supervision of pregnancy with history of pre-term labor, unspecified trimester: Secondary | ICD-10-CM

## 2016-12-11 DIAGNOSIS — Z3480 Encounter for supervision of other normal pregnancy, unspecified trimester: Secondary | ICD-10-CM | POA: Diagnosis not present

## 2016-12-11 DIAGNOSIS — O09899 Supervision of other high risk pregnancies, unspecified trimester: Secondary | ICD-10-CM | POA: Insufficient documentation

## 2016-12-11 NOTE — Progress Notes (Signed)
H/O preterm labor and used 17-P with last pregnancy.  Bedside U/S shows IUP with GA of [redacted]w[redacted]d.  Pt does have H/O abn pap and last pap 1 year ago @ [facility].  New insurance sao can do pap today

## 2016-12-12 ENCOUNTER — Ambulatory Visit (INDEPENDENT_AMBULATORY_CARE_PROVIDER_SITE_OTHER): Payer: 59 | Admitting: Advanced Practice Midwife

## 2016-12-12 ENCOUNTER — Encounter: Payer: Self-pay | Admitting: Advanced Practice Midwife

## 2016-12-12 VITALS — BP 106/69 | HR 76 | Wt 191.0 lb

## 2016-12-12 DIAGNOSIS — Z3481 Encounter for supervision of other normal pregnancy, first trimester: Secondary | ICD-10-CM

## 2016-12-12 DIAGNOSIS — Z113 Encounter for screening for infections with a predominantly sexual mode of transmission: Secondary | ICD-10-CM | POA: Diagnosis not present

## 2016-12-12 DIAGNOSIS — Z8742 Personal history of other diseases of the female genital tract: Secondary | ICD-10-CM

## 2016-12-12 DIAGNOSIS — Z3480 Encounter for supervision of other normal pregnancy, unspecified trimester: Secondary | ICD-10-CM

## 2016-12-12 DIAGNOSIS — O0990 Supervision of high risk pregnancy, unspecified, unspecified trimester: Secondary | ICD-10-CM

## 2016-12-12 DIAGNOSIS — N888 Other specified noninflammatory disorders of cervix uteri: Secondary | ICD-10-CM | POA: Insufficient documentation

## 2016-12-12 DIAGNOSIS — Z3491 Encounter for supervision of normal pregnancy, unspecified, first trimester: Secondary | ICD-10-CM

## 2016-12-12 DIAGNOSIS — O09219 Supervision of pregnancy with history of pre-term labor, unspecified trimester: Secondary | ICD-10-CM

## 2016-12-12 DIAGNOSIS — O09211 Supervision of pregnancy with history of pre-term labor, first trimester: Secondary | ICD-10-CM

## 2016-12-12 DIAGNOSIS — Z124 Encounter for screening for malignant neoplasm of cervix: Secondary | ICD-10-CM | POA: Diagnosis not present

## 2016-12-12 DIAGNOSIS — O9989 Other specified diseases and conditions complicating pregnancy, childbirth and the puerperium: Secondary | ICD-10-CM

## 2016-12-12 DIAGNOSIS — O09899 Supervision of other high risk pregnancies, unspecified trimester: Secondary | ICD-10-CM

## 2016-12-12 HISTORY — DX: Personal history of other diseases of the female genital tract: Z87.42

## 2016-12-12 LAB — PRENATAL PROFILE (SOLSTAS)
Antibody Screen: NEGATIVE
Basophils Absolute: 0 cells/uL (ref 0–200)
Basophils Relative: 0 %
Eosinophils Absolute: 71 cells/uL (ref 15–500)
Eosinophils Relative: 1 %
HEMATOCRIT: 37.8 % (ref 35.0–45.0)
HEP B S AG: NEGATIVE
HIV: NONREACTIVE
Hemoglobin: 12.3 g/dL (ref 11.7–15.5)
Lymphocytes Relative: 26 %
Lymphs Abs: 1846 cells/uL (ref 850–3900)
MCH: 27.2 pg (ref 27.0–33.0)
MCHC: 32.5 g/dL (ref 32.0–36.0)
MCV: 83.6 fL (ref 80.0–100.0)
MONO ABS: 639 {cells}/uL (ref 200–950)
MPV: 9.6 fL (ref 7.5–12.5)
Monocytes Relative: 9 %
NEUTROS ABS: 4544 {cells}/uL (ref 1500–7800)
Neutrophils Relative %: 64 %
Platelets: 443 10*3/uL — ABNORMAL HIGH (ref 140–400)
RBC: 4.52 MIL/uL (ref 3.80–5.10)
RDW: 13.9 % (ref 11.0–15.0)
Rh Type: POSITIVE
Rubella: 2.26 Index — ABNORMAL HIGH (ref <0.90)
WBC: 7.1 10*3/uL (ref 3.8–10.8)

## 2016-12-12 LAB — SICKLE CELL SCREEN: Sickle Cell Screen: NEGATIVE

## 2016-12-12 NOTE — Progress Notes (Signed)
  Subjective:    Paula Huff is being seen today for her first obstetrical visit.  This is a planned pregnancy. She is at [redacted]w[redacted]d gestation by certain LMP and [place] today. . Her obstetrical history is significant for spontaneous preterm delivery x 2. . Relationship with FOB: spouse, living together. Patient does intend to breast feed. Pregnancy history fully reviewed.  Patient reports no complaints.  Review of Systems:   Review of Systems  Constitutional: Negative for chills and fever.  Gastrointestinal: Negative for abdominal pain, diarrhea and vomiting.  Genitourinary: Negative for dysuria, vaginal bleeding and vaginal discharge.    Objective:     BP 106/69   Pulse 76   Wt 191 lb (86.6 kg)   LMP 10/24/2016   BMI 31.78 kg/m  Physical Exam  Constitutional: She is oriented to person, place, and time. She appears well-developed and well-nourished. No distress.  Neck: No thyromegaly present.  Cardiovascular: Normal rate and regular rhythm.   Respiratory: Effort normal and breath sounds normal. No respiratory distress.  GI: Soft. She exhibits no distension. There is no tenderness.  Genitourinary: Uterus normal. No vaginal discharge found.    Musculoskeletal: Normal range of motion. She exhibits no edema.  Neurological: She is alert and oriented to person, place, and time. She has normal reflexes.  Skin: Skin is warm and dry.  Psychiatric: She has a normal mood and affect.    Maternal Exam:  Introitus: Vagina is negative for discharge.   Pos FHR per BS [place].    Assessment:    Pregnancy: [id number] Patient Active Problem List   Diagnosis Date Noted  . History of abnormal cervical Pap smear 12/12/2016  . History of preterm labor 12/11/2016  . Supervision of normal pregnancy 12/09/2016  . Vitamin D deficiency 11/26/2016  . Class 1 obesity without serious comorbidity with body mass index (BMI) of 30.0 to 30.9 in adult 11/26/2016      1. Supervision of other normal pregnancy,  antepartum  - Cytology - PAP - Prenatal Profile - Culture, OB Urine - Sickle Cell Scr - [place] MFM Fetal Nuchal Translucency; Future  2. Encounter for supervision of normal pregnancy in first trimester, unspecified gravidity   3. History of preterm labor - Considering 17-P.   4. History of abnormal cervical Pap smear - Pap  5. Cervical cyst  - Will send in-basket message to attending RE- further testing and management   Plan:     Initial labs drawn. Prenatal vitamins. Problem list reviewed and updated. AFP3 discussed: ordered. Role of ultrasound in pregnancy discussed; fetal survey: requested. Amniocentesis discussed: not indicated. Follow up in 4 weeks.    Dorathy Kinsman 12/12/2016

## 2016-12-12 NOTE — Patient Instructions (Addendum)
Ask about Panorama testing (NIPS) and genetic carrier testing (Cystic Fibrosis, Vonita Moss, etc)   First Trimester of Pregnancy The first trimester of pregnancy is from week 1 until the end of week 13 (months 1 through 3). A week after a sperm fertilizes an egg, the egg will implant on the wall of the uterus. This embryo will begin to develop into a baby. Genes from you and your partner will form the baby. The female genes will determine whether the baby will be a boy or a girl. At 6-8 weeks, the eyes and face will be formed, and the heartbeat can be seen on ultrasound. At the end of 12 weeks, all the baby's organs will be formed. Now that you are pregnant, you will want to do everything you can to have a healthy baby. Two of the most important things are to get good prenatal care and to follow your health care provider's instructions. Prenatal care is all the medical care you receive before the baby's birth. This care will help prevent, find, and treat any problems during the pregnancy and childbirth. Body changes during your first trimester Your body goes through many changes during pregnancy. The changes vary from woman to woman.  You may gain or lose a couple of pounds at first.  You may feel sick to your stomach (nauseous) and you may throw up (vomit). If the vomiting is uncontrollable, call your health care provider.  You may tire easily.  You may develop headaches that can be relieved by medicines. All medicines should be approved by your health care provider.  You may urinate more often. Painful urination may mean you have a bladder infection.  You may develop heartburn as a result of your pregnancy.  You may develop constipation because certain hormones are causing the muscles that push stool through your intestines to slow down.  You may develop hemorrhoids or swollen veins (varicose veins).  Your breasts may begin to grow larger and become tender. Your nipples may stick out more, and  the tissue that surrounds them (areola) may become darker.  Your gums may bleed and may be sensitive to brushing and flossing.  Dark spots or blotches (chloasma, mask of pregnancy) may develop on your face. This will likely fade after the baby is born.  Your menstrual periods will stop.  You may have a loss of appetite.  You may develop cravings for certain kinds of food.  You may have changes in your emotions from day to day, such as being excited to be pregnant or being concerned that something may go wrong with the pregnancy and baby.  You may have more vivid and strange dreams.  You may have changes in your hair. These can include thickening of your hair, rapid growth, and changes in texture. Some women also have hair loss during or after pregnancy, or hair that feels dry or thin. Your hair will most likely return to normal after your baby is born. What to expect at prenatal visits During a routine prenatal visit:  You will be weighed to make sure you and the baby are growing normally.  Your blood pressure will be taken.  Your abdomen will be measured to track your baby's growth.  The fetal heartbeat will be listened to between weeks 10 and 14 of your pregnancy.  Test results from any previous visits will be discussed. Your health care provider may ask you:  How you are feeling.  If you are feeling the baby move.  If you have had any abnormal symptoms, such as leaking fluid, bleeding, severe headaches, or abdominal cramping.  If you are using any tobacco products, including cigarettes, chewing tobacco, and electronic cigarettes.  If you have any questions. Other tests that may be performed during your first trimester include:  Blood tests to find your blood type and to check for the presence of any previous infections. The tests will also be used to check for low iron levels (anemia) and protein on red blood cells (Rh antibodies). Depending on your risk factors, or if  you previously had diabetes during pregnancy, you may have tests to check for high blood sugar that affects pregnant women (gestational diabetes).  Urine tests to check for infections, diabetes, or protein in the urine.  An ultrasound to confirm the proper growth and development of the baby.  Fetal screens for spinal cord problems (spina bifida) and Down syndrome.  HIV (human immunodeficiency virus) testing. Routine prenatal testing includes screening for HIV, unless you choose not to have this test.  You may need other tests to make sure you and the baby are doing well. Follow these instructions at home: Medicines   Follow your health care provider's instructions regarding medicine use. Specific medicines may be either safe or unsafe to take during pregnancy.  Take a prenatal vitamin that contains at least 600 micrograms (mcg) of folic acid.  If you develop constipation, try taking a stool softener if your health care provider approves. Eating and drinking   Eat a balanced diet that includes fresh fruits and vegetables, whole grains, good sources of protein such as meat, eggs, or tofu, and low-fat dairy. Your health care provider will help you determine the amount of weight gain that is right for you.  Avoid raw meat and uncooked cheese. These carry germs that can cause birth defects in the baby.  Eating four or five small meals rather than three large meals a day may help relieve nausea and vomiting. If you start to feel nauseous, eating a few soda crackers can be helpful. Drinking liquids between meals, instead of during meals, also seems to help ease nausea and vomiting.  Limit foods that are high in fat and processed sugars, such as fried and sweet foods.  To prevent constipation:  Eat foods that are high in fiber, such as fresh fruits and vegetables, whole grains, and beans.  Drink enough fluid to keep your urine clear or pale yellow. Activity   Exercise only as directed by  your health care provider. Most women can continue their usual exercise routine during pregnancy. Try to exercise for 30 minutes at least 5 days a week. Exercising will help you:  Control your weight.  Stay in shape.  Be prepared for labor and delivery.  Experiencing pain or cramping in the lower abdomen or lower back is a good sign that you should stop exercising. Check with your health care provider before continuing with normal exercises.  Try to avoid standing for long periods of time. Move your legs often if you must stand in one place for a long time.  Avoid heavy lifting.  Wear low-heeled shoes and practice good posture.  You may continue to have sex unless your health care provider tells you not to. Relieving pain and discomfort   Wear a good support bra to relieve breast tenderness.  Take warm sitz baths to soothe any pain or discomfort caused by hemorrhoids. Use hemorrhoid cream if your health care provider approves.  Rest with  your legs elevated if you have leg cramps or low back pain.  If you develop varicose veins in your legs, wear support hose. Elevate your feet for 15 minutes, 3-4 times a day. Limit salt in your diet. Prenatal care   Schedule your prenatal visits by the twelfth week of pregnancy. They are usually scheduled monthly at first, then more often in the last 2 months before delivery.  Write down your questions. Take them to your prenatal visits.  Keep all your prenatal visits as told by your health care provider. This is important. Safety   Wear your seat belt at all times when driving.  Make a list of emergency phone numbers, including numbers for family, friends, the hospital, and police and fire departments. General instructions   Ask your health care provider for a referral to a local prenatal education class. Begin classes no later than the beginning of month 6 of your pregnancy.  Ask for help if you have counseling or nutritional needs during  pregnancy. Your health care provider can offer advice or refer you to specialists for help with various needs.  Do not use hot tubs, steam rooms, or saunas.  Do not douche or use tampons or scented sanitary pads.  Do not cross your legs for long periods of time.  Avoid cat litter boxes and soil used by cats. These carry germs that can cause birth defects in the baby and possibly loss of the fetus by miscarriage or stillbirth.  Avoid all smoking, herbs, alcohol, and medicines not prescribed by your health care provider. Chemicals in these products affect the formation and growth of the baby.  Do not use any products that contain nicotine or tobacco, such as cigarettes and e-cigarettes. If you need help quitting, ask your health care provider. You may receive counseling support and other resources to help you quit.  Schedule a dentist appointment. At home, brush your teeth with a soft toothbrush and be gentle when you floss. Contact a health care provider if:  You have dizziness.  You have mild pelvic cramps, pelvic pressure, or nagging pain in the abdominal area.  You have persistent nausea, vomiting, or diarrhea.  You have a bad smelling vaginal discharge.  You have pain when you urinate.  You notice increased swelling in your face, hands, legs, or ankles.  You are exposed to fifth disease or chickenpox.  You are exposed to [place] measles (rubella) and have never had it. Get help right away if:  You have a fever.  You are leaking fluid from your vagina.  You have spotting or bleeding from your vagina.  You have severe abdominal cramping or pain.  You have rapid weight gain or loss.  You vomit blood or material that looks like coffee grounds.  You develop a severe headache.  You have shortness of breath.  You have any kind of trauma, such as from a fall or a car accident. Summary  The first trimester of pregnancy is from week 1 until the end of week 13 (months 1  through 3).  Your body goes through many changes during pregnancy. The changes vary from woman to woman.  You will have routine prenatal visits. During those visits, your health care provider will examine you, discuss any test results you may have, and talk with you about how you are feeling. This information is not intended to replace advice given to you by your health care provider. Make sure you discuss any questions you have with your  health care provider. Document Released: 08/12/2001 Document Revised: 07/30/2016 Document Reviewed: 07/30/2016 Elsevier Interactive Patient Education  2017 [employer].

## 2016-12-13 LAB — CULTURE, OB URINE

## 2016-12-16 LAB — CYTOLOGY - PAP
Chlamydia: NEGATIVE
DIAGNOSIS: NEGATIVE
Neisseria Gonorrhea: NEGATIVE

## 2016-12-17 ENCOUNTER — Encounter: Payer: Self-pay | Admitting: Advanced Practice Midwife

## 2016-12-25 ENCOUNTER — Ambulatory Visit (INDEPENDENT_AMBULATORY_CARE_PROVIDER_SITE_OTHER): Payer: 59 | Admitting: Obstetrics & Gynecology

## 2016-12-25 VITALS — BP 121/68 | HR 86 | Wt 189.0 lb

## 2016-12-25 DIAGNOSIS — O9989 Other specified diseases and conditions complicating pregnancy, childbirth and the puerperium: Secondary | ICD-10-CM

## 2016-12-25 DIAGNOSIS — R309 Painful micturition, unspecified: Secondary | ICD-10-CM

## 2016-12-25 DIAGNOSIS — N898 Other specified noninflammatory disorders of vagina: Secondary | ICD-10-CM

## 2016-12-25 DIAGNOSIS — O0990 Supervision of high risk pregnancy, unspecified, unspecified trimester: Secondary | ICD-10-CM

## 2016-12-25 LAB — POCT URINALYSIS DIPSTICK
Bilirubin, UA: NEGATIVE
Blood, UA: NEGATIVE
Glucose, UA: NEGATIVE
Leukocytes, UA: NEGATIVE
Nitrite, UA: NEGATIVE
PH UA: 5 (ref 5.0–8.0)
Protein, UA: NEGATIVE
SPEC GRAV UA: 1.01 (ref 1.010–1.025)
Urobilinogen, UA: 0.2 E.U./dL

## 2016-12-25 NOTE — Progress Notes (Signed)
   Subjective:    Patient ID: Paula Huff, female    DOB: November 20, 1990, 26 y.o.   MRN: [id number]  HPI  26 yo lady at 8.[redacted] weeks EGA here with a 2 week h/o some pelvic pain and pressure, no help with Tylenol or IBU. She denies VB, ROM  Review of Systems     Objective:   Physical Exam [id number] Breathing, conversing, and ambulating normally Cervix is closed  There is a 2 cm simple cyst of the vaginal wall, to the right of the cervix Bedside u/s shows a normal FHR       Assessment & Plan:  Pelvic pain and pressure. I am not sure that the cause is the vaginal wall cyst, but it could be. I offered to drain it with a spinal needle. At this time, she wants to wait and see. It can be drained at her convenience Keep appt 01/09/17

## 2017-01-09 ENCOUNTER — Encounter (HOSPITAL_COMMUNITY): Payer: Self-pay | Admitting: *Deleted

## 2017-01-09 ENCOUNTER — Inpatient Hospital Stay (HOSPITAL_COMMUNITY): Payer: 59

## 2017-01-09 ENCOUNTER — Inpatient Hospital Stay (HOSPITAL_COMMUNITY)
Admission: AD | Admit: 2017-01-09 | Discharge: 2017-01-09 | Disposition: A | Discharge status: Home / Self Care | Payer: 59 | Source: Ambulatory Visit | Attending: Family Medicine | Admitting: Family Medicine

## 2017-01-09 ENCOUNTER — Ambulatory Visit (INDEPENDENT_AMBULATORY_CARE_PROVIDER_SITE_OTHER): Payer: 59 | Admitting: Obstetrics and Gynecology

## 2017-01-09 VITALS — BP 107/65 | HR 81 | Wt 186.0 lb

## 2017-01-09 DIAGNOSIS — O0991 Supervision of high risk pregnancy, unspecified, first trimester: Secondary | ICD-10-CM

## 2017-01-09 DIAGNOSIS — Z818 Family history of other mental and behavioral disorders: Secondary | ICD-10-CM | POA: Insufficient documentation

## 2017-01-09 DIAGNOSIS — O200 Threatened abortion: Secondary | ICD-10-CM | POA: Insufficient documentation

## 2017-01-09 DIAGNOSIS — O9989 Other specified diseases and conditions complicating pregnancy, childbirth and the puerperium: Secondary | ICD-10-CM | POA: Diagnosis not present

## 2017-01-09 DIAGNOSIS — O99211 Obesity complicating pregnancy, first trimester: Secondary | ICD-10-CM

## 2017-01-09 DIAGNOSIS — Z833 Family history of diabetes mellitus: Secondary | ICD-10-CM | POA: Insufficient documentation

## 2017-01-09 DIAGNOSIS — Z3A11 11 weeks gestation of pregnancy: Secondary | ICD-10-CM | POA: Insufficient documentation

## 2017-01-09 DIAGNOSIS — N939 Abnormal uterine and vaginal bleeding, unspecified: Secondary | ICD-10-CM | POA: Diagnosis present

## 2017-01-09 DIAGNOSIS — O0990 Supervision of high risk pregnancy, unspecified, unspecified trimester: Secondary | ICD-10-CM

## 2017-01-09 DIAGNOSIS — E669 Obesity, unspecified: Secondary | ICD-10-CM

## 2017-01-09 DIAGNOSIS — G4700 Insomnia, unspecified: Secondary | ICD-10-CM | POA: Diagnosis not present

## 2017-01-09 DIAGNOSIS — IMO0002 Reserved for concepts with insufficient information to code with codable children: Secondary | ICD-10-CM

## 2017-01-09 DIAGNOSIS — O99612 Diseases of the digestive system complicating pregnancy, second trimester: Secondary | ICD-10-CM | POA: Insufficient documentation

## 2017-01-09 DIAGNOSIS — O209 Hemorrhage in early pregnancy, unspecified: Secondary | ICD-10-CM

## 2017-01-09 DIAGNOSIS — Z8249 Family history of ischemic heart disease and other diseases of the circulatory system: Secondary | ICD-10-CM | POA: Diagnosis not present

## 2017-01-09 DIAGNOSIS — R7303 Prediabetes: Secondary | ICD-10-CM | POA: Insufficient documentation

## 2017-01-09 DIAGNOSIS — Z7984 Long term (current) use of oral hypoglycemic drugs: Secondary | ICD-10-CM | POA: Insufficient documentation

## 2017-01-09 DIAGNOSIS — K219 Gastro-esophageal reflux disease without esophagitis: Secondary | ICD-10-CM | POA: Insufficient documentation

## 2017-01-09 DIAGNOSIS — O99352 Diseases of the nervous system complicating pregnancy, second trimester: Secondary | ICD-10-CM | POA: Insufficient documentation

## 2017-01-09 DIAGNOSIS — Z3491 Encounter for supervision of normal pregnancy, unspecified, first trimester: Secondary | ICD-10-CM

## 2017-01-09 DIAGNOSIS — O208 Other hemorrhage in early pregnancy: Secondary | ICD-10-CM | POA: Diagnosis not present

## 2017-01-09 NOTE — Discharge Instructions (Signed)
Threatened Miscarriage °A threatened miscarriage occurs when you have vaginal bleeding during your first 20 weeks of pregnancy but the pregnancy has not ended. If you have vaginal bleeding during this time, your health care provider will do tests to make sure you are still pregnant. If the tests show you are still pregnant and the developing baby (fetus) inside your womb (uterus) is still growing, your condition is considered a threatened miscarriage. °A threatened miscarriage does not mean your pregnancy will end, but it does increase the risk of losing your pregnancy (complete miscarriage). °What are the causes? °The cause of a threatened miscarriage is usually not known. If you go on to have a complete miscarriage, the most common cause is an abnormal number of chromosomes in the developing baby. Chromosomes are the structures inside cells that hold all your genetic material. °Some causes of vaginal bleeding that do not result in miscarriage include: °· Having sex. °· Having an infection. °· Normal hormone changes of pregnancy. °· Bleeding that occurs when an egg implants in your uterus. °What increases the risk? °Risk factors for bleeding in early pregnancy include: °· Obesity. °· Smoking. °· Drinking excessive amounts of alcohol or caffeine. °· Recreational drug use. °What are the signs or symptoms? °· Light vaginal bleeding. °· Mild abdominal pain or cramps. °How is this diagnosed? °If you have bleeding with or without abdominal pain before 20 weeks of pregnancy, your health care provider will do tests to check whether you are still pregnant. One important test involves using sound waves and a computer (ultrasound) to create images of the inside of your uterus. Other tests include an internal exam of your vagina and uterus (pelvic exam) and measurement of your baby’s heart rate. °You may be diagnosed with a threatened miscarriage if: °· Ultrasound testing shows you are still pregnant. °· Your baby’s heart rate  is strong. °· A pelvic exam shows that the opening between your uterus and your vagina (cervix) is closed. °· Your heart rate and blood pressure are stable. °· Blood tests confirm you are still pregnant. °How is this treated? °No treatments have been shown to prevent a threatened miscarriage from going on to a complete miscarriage. However, the right home care is important. °Follow these instructions at home: °· Make sure you keep all your appointments for prenatal care. This is very important. °· Get plenty of rest. °· Do not have sex or use tampons if you have vaginal bleeding. °· Do not douche. °· Do not smoke or use recreational drugs. °· Do not drink alcohol. °· Avoid caffeine. °Contact a health care provider if: °· You have light vaginal bleeding or spotting while pregnant. °· You have abdominal pain or cramping. °· You have a fever. °Get help right away if: °· You have heavy vaginal bleeding. °· You have blood clots coming from your vagina. °· You have severe low back pain or abdominal cramps. °· You have fever, chills, and severe abdominal pain. °This information is not intended to replace advice given to you by your health care provider. Make sure you discuss any questions you have with your health care provider. °Document Released: 08/18/2005 Document Revised: 01/24/2016 Document Reviewed: 06/14/2013 °Elsevier Interactive Patient Education © 2017 Elsevier Inc[employer] ° °

## 2017-01-09 NOTE — Progress Notes (Signed)
   PRENATAL VISIT NOTE  Subjective:  Paula Huff is a 26 y.o. [id number] at 5650w0d being seen today for ongoing prenatal care.  She is currently monitored for the following issues for this high-risk pregnancy and has Vitamin D deficiency; Class 1 obesity without serious comorbidity with body mass index (BMI) of 30.0 to 30.9 in adult; Supervision of high risk pregnancy, antepartum; History of preterm delivery, currently pregnant; History of abnormal cervical Pap smear; and Cervical cyst on her problem list.  Patient reports some nausea at night.  Contractions: Not present. Vag. Bleeding: None.  Movement: Absent. Denies leaking of fluid.   The following portions of the patient's history were reviewed and updated as appropriate: allergies, current medications, past family history, past medical history, past social history, past surgical history and problem list. Problem list updated.  Objective:   Vitals:   01/09/17 1119  BP: 107/65  Pulse: 81  Weight: 186 lb (84.4 kg)    Fetal Status: Fetal Heart Rate (bpm): 154   Movement: Absent     General:  Alert, oriented and cooperative. Patient is in no acute distress.  Skin: Skin is warm and dry. No rash noted.   Cardiovascular: Normal heart rate noted  Respiratory: Normal respiratory effort, no problems with respiration noted  Abdomen: Soft, gravid, appropriate for gestational age. Pain/Pressure: Absent     Pelvic:  Cervical exam deferred        Extremities: Normal range of motion.  Edema: None  Mental Status: Normal mood and affect. Normal behavior. Normal judgment and thought content.   Assessment and Plan:  Pregnancy: [id number] at 3350w0d  1. Supervision of high risk pregnancy, antepartum -  Has first trimester genetic screening scheduled for 01/24/17 - Discussed second trimester  Preterm labor symptoms and general obstetric precautions including but not limited to vaginal bleeding, contractions, leaking of fluid and fetal movement were  reviewed in detail with the patient. Please refer to After Visit Summary for other counseling recommendations.  Return in about 4 weeks (around 02/06/2017) for Return OB - [facility].   Raelyn Moraawson, Kyzer Blowe, CNM

## 2017-01-09 NOTE — Patient Instructions (Signed)

## 2017-01-09 NOTE — MAU Note (Signed)
Vaginal bleeding -felt like peeing on self but it was blood- started at 50 min ago.  No clots. Heart [place]

## 2017-01-09 NOTE — MAU Provider Note (Signed)
History     CSN: [id number]  Arrival date and time: 01/09/17 2134  First Provider Initiated Contact with Patient 01/09/17 2212      Chief Complaint  Patient presents with  . Vaginal Bleeding   HPI Paula Huff is a 26 y.o. [id number] at [redacted]w[redacted]d who presents with vaginal bleeding that started 1 hours prior to arrival. Felt like she was urinating on herself but when she looked down noted a large amount of blood in her underwear. Bleeding has continued but not as heavy as when it started. No blood clots. Mild abdominal cramping that started with bleeding. Rates pain 2/10. No recent intercourse. Had OB appointment today; did not have pelvic or vaginal exam.  OB History    Gravida Para Term Preterm AB Living   3 2 0 2   2   SAB TAB Ectopic Multiple Live Births           2      Past Medical History:  Diagnosis Date  . Anxiety   . Depression   . GERD (gastroesophageal reflux disease)   . History of preterm labor   . Insomnia   . Medical history non-contributory   . Palpitations   . Prediabetes   . Vaginal Pap smear, abnormal     Past Surgical History:  Procedure Laterality Date  . NO PAST SURGERIES      Family History  Problem Relation Age of Onset  . Hypertension Mother   . Depression Mother   . Anxiety disorder Mother   . Diabetes Father   . Obesity Father   . Hypothyroidism Sister   . Diabetes Brother   . Diabetes Maternal Grandmother     Social History  Substance Use Topics  . Smoking status: Never Smoker  . Smokeless tobacco: Never Used  . Alcohol use No    Allergies: No Known Allergies  Prescriptions Prior to Admission  Medication Sig Dispense Refill Last Dose  . fexofenadine (ALLEGRA ALLERGY) 180 MG tablet Take 1 tablet (180 mg total) by mouth daily. 30 tablet 2 Taking  . fluticasone (FLONASE) 50 MCG/ACT nasal spray Place 2 sprays into both nostrils daily. 48 g 2 Taking  . Melatonin 10 MG TABS Take 1 tablet by mouth every evening. 30 tablet 0 Taking  .  metFORMIN (GLUCOPHAGE) 500 MG tablet Take 1 tablet (500 mg total) by mouth daily with breakfast. (Patient not taking: Reported on 12/12/2016) 30 tablet 0 Not Taking    Review of Systems  Constitutional: Negative.   Gastrointestinal: Positive for abdominal pain. Negative for constipation, diarrhea, nausea and vomiting.  Genitourinary: Positive for vaginal bleeding. Negative for dysuria and vaginal discharge.   Physical Exam   Blood pressure 117/75, pulse 93, temperature 98.5 F (36.9 C), temperature source Oral, resp. rate 18, height 5\' 5"  (1.651 m), weight 189 lb 4 oz (85.8 kg), last menstrual period 10/24/2016, SpO2 100 %, unknown if currently breastfeeding.  Physical Exam  Nursing note and vitals reviewed. Constitutional: She is oriented to person, place, and time. She appears well-developed and well-nourished. No distress.  HENT:  Head: Normocephalic and atraumatic.  Eyes: Conjunctivae are normal. Right eye exhibits no discharge. Left eye exhibits no discharge. No scleral icterus.  Neck: Normal range of motion.  Respiratory: Effort normal. No respiratory distress.  Genitourinary: Cervix exhibits no friability. There is bleeding (small amount of dark red mucoid blood; no active bleeding) in the vagina.  Genitourinary Comments: Cervix closed.  ~2 cm fluid filled cyst left of cervix (  previously documented by CNM)  Neurological: She is alert and oriented to person, place, and time.  Skin: Skin is warm and dry. She is not diaphoretic.  Psychiatric: She has a normal mood and affect. Her behavior is normal. Judgment and thought content normal.    MAU Course  Procedures No results found for this or any previous visit (from the past 24 hour(s)). [place] Ob Comp Less 14 Wks  Result Date: 01/09/2017 CLINICAL DATA:  Bleeding. No fetal heart tones. Estimated gestational age by LMP is 11 weeks 0 days. Quantitative beta HCG was not ordered. EXAM: OBSTETRIC <14 WK ULTRASOUND TECHNIQUE: Transabdominal  ultrasound was performed for evaluation of the gestation as well as the maternal uterus and adnexal regions. COMPARISON:  None. FINDINGS: Intrauterine gestational sac: A single intrauterine pregnancy is identified. Yolk sac: Yolk sac is not visualized. This is consistent with the gestational age. Embryo:  Fetal pole is identified. Cardiac Activity: Fetal cardiac activity is present. Heart Rate: 165 bpm CRL:   50.2  mm   11 w 5 d                  US EDC: 07/26/2017 Subchorionic hemorrhage:  None visualized. Maternal uterus/adnexae: No myometrial mass lesions identified. Cervix is not well visualized on transabdominal imaging. Both ovaries are seen and appear normal. No abnormal adnexal masses. No free fluid in the pelvis. IMPRESSION: Single intrauterine pregnancy. Estimated gestational age based on crown-rump length is 11 weeks 5 days. No acute complication is demonstrated sonographically. Electronically Signed   By: Burman NievesWilliam  Stevens M.D.   On: 01/09/2017 23:41    MDM RN unable to doppler FHT Cervix closed Ultrasound shows IUP with cardiac activity, FHR 165-- no SCH B positive Assessment and Plan  A; 1. Threatened miscarriage   2. Fetal heart tones not heard   3. Vaginal bleeding in pregnancy, first trimester   4. Normal IUP (intrauterine pregnancy) on prenatal ultrasound, first trimester    P: Discharge home Pelvic rest Discussed reasons to return to MAU Keep f/u with OB  Judeth HornErin Gal Smolinski 01/09/2017, 10:11 PM

## 2017-01-12 ENCOUNTER — Encounter: Payer: Self-pay | Admitting: Obstetrics and Gynecology

## 2017-01-13 ENCOUNTER — Ambulatory Visit (INDEPENDENT_AMBULATORY_CARE_PROVIDER_SITE_OTHER): Payer: 59 | Admitting: Obstetrics & Gynecology

## 2017-01-13 DIAGNOSIS — O468X1 Other antepartum hemorrhage, first trimester: Secondary | ICD-10-CM | POA: Diagnosis not present

## 2017-01-13 DIAGNOSIS — O418X10 Other specified disorders of amniotic fluid and membranes, first trimester, not applicable or unspecified: Secondary | ICD-10-CM | POA: Diagnosis not present

## 2017-01-13 DIAGNOSIS — O468X9 Other antepartum hemorrhage, unspecified trimester: Secondary | ICD-10-CM

## 2017-01-13 DIAGNOSIS — O418X90 Other specified disorders of amniotic fluid and membranes, unspecified trimester, not applicable or unspecified: Secondary | ICD-10-CM | POA: Insufficient documentation

## 2017-01-13 DIAGNOSIS — O0990 Supervision of high risk pregnancy, unspecified, unspecified trimester: Secondary | ICD-10-CM

## 2017-01-13 DIAGNOSIS — O0991 Supervision of high risk pregnancy, unspecified, first trimester: Secondary | ICD-10-CM

## 2017-01-13 NOTE — Progress Notes (Signed)
   PRENATAL VISIT NOTE  Subjective:  Paula Huff is a 26 y.o. [id number] at 4610w4d being seen today for ongoing prenatal care.  This is a work in visit for vaginal bleeding. This has been recently evaluated in the MAU. An u/s showed a subchorionic hemorrhage. The bleeding has decreased since that visit. She is currently monitored for the following issues for this low-risk pregnancy and has Vitamin D deficiency; Class 1 obesity without serious comorbidity with body mass index (BMI) of 30.0 to 30.9 in adult; Supervision of high risk pregnancy, antepartum; History of preterm delivery, currently pregnant; History of abnormal cervical Pap smear; Cervical cyst; and Subchorionic hemorrhage on her problem list.  Patient reports bleeding and cramping..   .  .   . Denies leaking of fluid.   The following portions of the patient's history were reviewed and updated as appropriate: allergies, current medications, past family history, past medical history, past social history, past surgical history and problem list. Problem list updated.  Objective:  There were no vitals filed for this visit.  Fetal Status:           General:  Alert, oriented and cooperative. Patient is in no acute distress.  Skin: Skin is warm and dry. No rash noted.   Cardiovascular: Normal heart rate noted  Respiratory: Normal respiratory effort, no problems with respiration noted  Abdomen: Soft, gravid, appropriate for gestational age.       Pelvic:  Cervical exam performed        Extremities: Normal range of motion.     Mental Status: Normal mood and affect. Normal behavior. Normal judgment and thought content.   Assessment and Plan:  Pregnancy: [id number] at 3510w4d  1. Subchorionic hemorrhage of placenta in first trimester, single or unspecified fetus - Pelvic rest rec'd - Tylenol, IBU prn (until 28 weeks)  2. Supervision of high risk pregnancy, antepartum - First screen appt next week  Preterm labor symptoms and general obstetric  precautions including but not limited to vaginal bleeding, contractions, leaking of fluid and fetal movement were reviewed in detail with the patient. Please refer to After Visit Summary for other counseling recommendations.  No Follow-up on file.   Allie Bossierove, Jorita Bohanon C, MD

## 2017-01-13 NOTE — Progress Notes (Signed)
Patient has had a small amount of bleeding today patient notes changing one panty liner Bedside ultrasound performed.  CLINICAL DATA:  Pregnant patient in 1 st trimester pregnancy with complaints of vaginal bleeding and cramping.   EXAM:abdominal ultrasound  TECHNIQUE:  abdominal ultrasound was performed for complete evaluation of the gestation as well as the maternal uterus, adnexal regions, and pelvic cul-de-sac.   FINDINGS: Intrauterine gestational sac: single Embryo:  present  Cardiac Activity: present Heart Rate:164 bpm.   Subchorionic hemorrhage:  not noted.  Armandina StammerJennifer Howard RN BSN

## 2017-01-14 NOTE — Telephone Encounter (Signed)
Spoke to patient on the phone in regards to an e-mail question she had.  Reviewed bleeding precautions and threatened miscarriage risk.  [facility] increased bleeding and/or pain.  Patient states she "thinks the bleeding is getting better."  She also was reassured about what is going on in her pregnancy after being seen yesterday.  Next appt is 02/06/2017.  All questions and concerns addressed with TC. Patient verbalized an understanding of the plan of care and agrees.  Raelyn Moraolitta Chandell Attridge MSN, CNM 01/14/2017 2:43 PM

## 2017-01-20 ENCOUNTER — Other Ambulatory Visit: Payer: Self-pay | Admitting: Advanced Practice Midwife

## 2017-01-20 ENCOUNTER — Encounter (HOSPITAL_COMMUNITY): Payer: Self-pay

## 2017-01-20 ENCOUNTER — Ambulatory Visit (HOSPITAL_COMMUNITY)
Admission: RE | Admit: 2017-01-20 | Discharge: 2017-01-20 | Disposition: A | Discharge status: Home / Self Care | Payer: 59 | Source: Ambulatory Visit | Attending: Family Medicine | Admitting: Family Medicine

## 2017-01-20 ENCOUNTER — Ambulatory Visit (HOSPITAL_COMMUNITY)
Admission: RE | Admit: 2017-01-20 | Discharge: 2017-01-20 | Disposition: A | Discharge status: Home / Self Care | Payer: 59 | Source: Ambulatory Visit | Attending: Advanced Practice Midwife | Admitting: Advanced Practice Midwife

## 2017-01-20 DIAGNOSIS — O09899 Supervision of other high risk pregnancies, unspecified trimester: Secondary | ICD-10-CM

## 2017-01-20 DIAGNOSIS — O09219 Supervision of pregnancy with history of pre-term labor, unspecified trimester: Secondary | ICD-10-CM

## 2017-01-20 DIAGNOSIS — Z3682 Encounter for antenatal screening for nuchal translucency: Secondary | ICD-10-CM | POA: Diagnosis not present

## 2017-01-20 DIAGNOSIS — Z3A12 12 weeks gestation of pregnancy: Secondary | ICD-10-CM | POA: Diagnosis not present

## 2017-01-20 DIAGNOSIS — O09211 Supervision of pregnancy with history of pre-term labor, first trimester: Secondary | ICD-10-CM | POA: Diagnosis not present

## 2017-01-20 DIAGNOSIS — Z3480 Encounter for supervision of other normal pregnancy, unspecified trimester: Secondary | ICD-10-CM

## 2017-02-06 ENCOUNTER — Ambulatory Visit (INDEPENDENT_AMBULATORY_CARE_PROVIDER_SITE_OTHER): Payer: 59 | Admitting: Advanced Practice Midwife

## 2017-02-06 VITALS — BP 118/74 | HR 89 | Wt 187.0 lb

## 2017-02-06 DIAGNOSIS — O0992 Supervision of high risk pregnancy, unspecified, second trimester: Secondary | ICD-10-CM

## 2017-02-06 DIAGNOSIS — O09219 Supervision of pregnancy with history of pre-term labor, unspecified trimester: Principal | ICD-10-CM

## 2017-02-06 DIAGNOSIS — O0990 Supervision of high risk pregnancy, unspecified, unspecified trimester: Secondary | ICD-10-CM

## 2017-02-06 DIAGNOSIS — O09212 Supervision of pregnancy with history of pre-term labor, second trimester: Secondary | ICD-10-CM

## 2017-02-06 DIAGNOSIS — O09899 Supervision of other high risk pregnancies, unspecified trimester: Secondary | ICD-10-CM

## 2017-02-06 NOTE — Patient Instructions (Signed)
Hydroxyprogesterone solution for injection What is this medicine? HYDROXYPROGESTERONE (hye drox ee proe JES ter one) is a female hormone. This medicine is used in women who are pregnant and who have delivered a baby too early (preterm) in the past. It helps lower the risk of having a preterm baby again. This medicine may be used for other purposes; ask your health care provider or pharmacist if you have questions. COMMON BRAND NAME(S): Makena What should I tell my health care provider before I take this medicine? They need to know if you have any of these conditions: -blood clotting disorders -breast, cervical, uterine, or vaginal cancer -depression -diabetes or prediabetes -heart disease -high blood pressure -kidney disease -liver disease -lung or breathing disease, like asthma -migraine headaches -seizures -vaginal bleeding -an unusual or allergic reaction to hydroxyprogesterone, other hormones, medicines, foods, dyes, castor oil, benzyl alcohol, or other preservatives -breast-feeding How should I use this medicine? This medicine is for injection into a muscle. It is given by a health care professional in a hospital or clinic setting. You are likely to get an injection once a week to prevent preterm delivery. Talk to your pediatrician regarding the use of this medicine in children. Special care may be needed. Overdosage: If you think you have taken too much of this medicine contact a poison control center or emergency room at once. NOTE: This medicine is only for you. Do not share this medicine with others. What if I miss a dose? It is important not to miss your dose. Call your doctor or health care professional if you are unable to keep an appointment. What may interact with this medicine? -acetaminophen -bupropion -clozapine -efavirenz -halothane -methadone -nicotine -theophylline, aminophylline -tizanidine This list may not describe all possible interactions. Give your  health care provider a list of all the medicines, herbs, non-prescription drugs, or dietary supplements you use. Also tell them if you smoke, drink alcohol, or use illegal drugs. Some items may interact with your medicine. What should I watch for while using this medicine? Your condition will be monitored carefully while you are receiving this medicine. What side effects may I notice from receiving this medicine? Side effects that you should report to your doctor or health care professional as soon as possible: -allergic reactions like skin rash, itching or hives, swelling of the face, lips, or tongue -breathing problems -breast tissue changes or discharge -changes in vision -confusion, trouble speaking or understanding -depressed mood -increased hunger or thirst -increased urination -pain, redness, or irritation at site where injected -pain, swelling, warmth in the leg -shortness of breath, chest pain, swelling in a leg -sudden numbness or weakness of the face, arm or leg -sudden severe headaches -trouble walking, dizziness, loss of balance or coordination -unusually weak or tired -vaginal bleeding -yellowing of the eyes or skin Side effects that usually do not require medical attention (report to your doctor or health care professional if they continue or are bothersome): -changes in emotions or moods -diarrhea -fluid retention and swelling -nausea This list may not describe all possible side effects. Call your doctor for medical advice about side effects. You may report side effects to FDA at 1-800-FDA-1088. Where should I keep my medicine? This drug is given in a hospital or clinic and will not be stored at home. NOTE: This sheet is a summary. It may not cover all possible information. If you have questions about this medicine, talk to your doctor, pharmacist, or health care provider.  2018 Elsevier/Gold Standard (2009-10-09 11:17:12)  

## 2017-02-12 ENCOUNTER — Other Ambulatory Visit: Payer: 59

## 2017-02-13 ENCOUNTER — Other Ambulatory Visit (INDEPENDENT_AMBULATORY_CARE_PROVIDER_SITE_OTHER): Payer: 59

## 2017-02-13 DIAGNOSIS — Z3481 Encounter for supervision of other normal pregnancy, first trimester: Secondary | ICD-10-CM

## 2017-02-13 DIAGNOSIS — Z3A16 16 weeks gestation of pregnancy: Secondary | ICD-10-CM

## 2017-02-16 LAB — AFP, QUAD SCREEN
AFP: 30 ng/mL
Age Alone: 1:860 {titer}
CURR GEST AGE: 16 wk
Down Syndrome Scr Risk Est: 1:2550 {titer}
HCG, Total: 90.73 IU/mL
INH: 155.4 pg/mL
INTERPRETATION-AFP: NEGATIVE
MoM for AFP: 0.9
MoM for INH: 0.97
MoM for hCG: 2.47
OPEN SPINA BIFIDA: NEGATIVE
Osb Risk: 1:38100 {titer}
TRI 18 SCR RISK EST: NEGATIVE
Trisomy 18 (Edward) Syndrome Interp.: 1:59200 {titer}
UE3 VALUE: 0.89 ng/mL
uE3 Mom: 1.24

## 2017-02-23 ENCOUNTER — Encounter: Payer: Self-pay | Admitting: Obstetrics & Gynecology

## 2017-02-23 ENCOUNTER — Ambulatory Visit (INDEPENDENT_AMBULATORY_CARE_PROVIDER_SITE_OTHER): Payer: 59 | Admitting: Obstetrics & Gynecology

## 2017-02-23 VITALS — BP 113/69 | HR 83 | Wt 191.0 lb

## 2017-02-23 DIAGNOSIS — O09212 Supervision of pregnancy with history of pre-term labor, second trimester: Secondary | ICD-10-CM

## 2017-02-23 DIAGNOSIS — E559 Vitamin D deficiency, unspecified: Secondary | ICD-10-CM | POA: Diagnosis not present

## 2017-02-23 DIAGNOSIS — R319 Hematuria, unspecified: Secondary | ICD-10-CM

## 2017-02-23 DIAGNOSIS — O0990 Supervision of high risk pregnancy, unspecified, unspecified trimester: Secondary | ICD-10-CM

## 2017-02-23 DIAGNOSIS — O0992 Supervision of high risk pregnancy, unspecified, second trimester: Secondary | ICD-10-CM

## 2017-02-23 DIAGNOSIS — O09219 Supervision of pregnancy with history of pre-term labor, unspecified trimester: Secondary | ICD-10-CM

## 2017-02-23 DIAGNOSIS — O09899 Supervision of other high risk pregnancies, unspecified trimester: Secondary | ICD-10-CM

## 2017-02-23 NOTE — Progress Notes (Signed)
Numbness in leg and has fallen twice over the weekend.    PRENATAL VISIT NOTE  Subjective:  Paula Huff is a 26 y.o. [id number] at 6928w3d being seen today for ongoing prenatal care.  She is currently monitored for the following issues for this high-risk pregnancy and has Vitamin D deficiency; Class 1 obesity without serious comorbidity with body mass index (BMI) of 30.0 to 30.9 in adult; Supervision of high risk pregnancy, antepartum; History of preterm delivery, currently pregnant; History of abnormal cervical Pap smear; Cervical cyst; and Subchorionic hemorrhage on her problem list.  Patient reports right leg numbness.  Contractions: Not present. Vag. Bleeding: None.  Movement: Present. Denies leaking of fluid.   The following portions of the patient's history were reviewed and updated as appropriate: allergies, current medications, past family history, past medical history, past social history, past surgical history and problem list. Problem list updated.  Objective:   Vitals:   02/23/17 1604  BP: 113/69  Pulse: 83  Weight: 191 lb (86.6 kg)    Fetal Status: Fetal Heart Rate (bpm): 143   Movement: Present     General:  Alert, oriented and cooperative. Patient is in no acute distress.  Skin: Skin is warm and dry. No rash noted.   Cardiovascular: Normal heart rate noted  Respiratory: Normal respiratory effort, no problems with respiration noted  Abdomen: Soft, gravid, appropriate for gestational age. Pain/Pressure: Absent     Pelvic:  Cervical exam deferred        Extremities: Normal range of motion.  Edema: None; ambulating well through visit.  Able to get on an doff the exam table without issue.  Mental Status: Normal mood and affect. Normal behavior. Normal judgment and thought content.   Assessment and Plan:  Pregnancy: [id number] at 6728w3d  1. Vitamin D deficiency - VITAMIN D 25 Hydroxy (Vit-D Deficiency, Fractures)  2. Supervision of high risk pregnancy, antepartum -anatomy US  scheduled.  3. Hematuria, unspecified type - CULTURE, URINE COMPREHENSIVE  4. History of preterm delivery, currently pregnant Strongly encouraged to take 17P as she delivered at 27 weeks the first pregnancy and 26 weeks with second pregnancy with 17P and [facility] prog at [facility] weeks [facility]  5.  Right leg numbness Pt will see Dr. Karie Schwalbet and evaluat/treat.  He will determine scans or referrals as necessary. 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  She feels she can see him very soon (even tomorrow).  Preterm labor symptoms and general obstetric precautions including but not limited to vaginal bleeding, contractions, leaking of fluid and fetal movement were reviewed in detail with the patient. Please refer to After Visit Summary for other counseling recommendations.  Return in about 3 weeks (around 03/16/2017).   Elsie LincolnKelly Kamarri Fischetti, MD

## 2017-02-24 ENCOUNTER — Ambulatory Visit (INDEPENDENT_AMBULATORY_CARE_PROVIDER_SITE_OTHER): Payer: 59 | Admitting: Family Medicine

## 2017-02-24 ENCOUNTER — Encounter: Payer: Self-pay | Admitting: Family Medicine

## 2017-02-24 DIAGNOSIS — G5711 Meralgia paresthetica, right lower limb: Secondary | ICD-10-CM | POA: Diagnosis not present

## 2017-02-24 DIAGNOSIS — M6281 Muscle weakness (generalized): Secondary | ICD-10-CM | POA: Diagnosis not present

## 2017-02-24 LAB — VITAMIN D 25 HYDROXY (VIT D DEFICIENCY, FRACTURES): VIT D 25 HYDROXY: 18 ng/mL — AB (ref 30–100)

## 2017-02-24 NOTE — Patient Instructions (Signed)
Thank you for coming in today. Attend PT.  Recheck as needed.  Keep me updated.  The condition is Meralgia Paresthetica.

## 2017-02-24 NOTE — Progress Notes (Signed)
Paula Huff is a 26 y.o. female who presents to [facility] today for right leg numbness. Patient notes a several month history of numbness into the right lateral leg. This occurs from the anterior hip to the lateral knee. She notes some mild subjective weakness to the right thigh and has fallen once or twice. She currently is about [redacted] weeks pregnant. She denies any injury fevers or chills nausea or vomiting.   Past Medical History:  Diagnosis Date  . Anxiety   . Depression   . GERD (gastroesophageal reflux disease)   . History of preterm labor   . Insomnia   . Medical history non-contributory   . Palpitations   . Prediabetes   . Vaginal Pap smear, abnormal    Past Surgical History:  Procedure Laterality Date  . NO PAST SURGERIES     Social History  Substance Use Topics  . Smoking status: Never Smoker  . Smokeless tobacco: Never Used  . Alcohol use No     ROS:  As above   Medications: Current Outpatient Prescriptions  Medication Sig Dispense Refill  . Prenatal Vit-Fe Fumarate-FA (PRENATAL VITAMIN PO) Take by mouth.     No current facility-administered medications for this visit.    No Known Allergies   Exam:  BP 115/78   Pulse 82   Wt 191 lb (86.6 kg)   LMP 10/24/2016   BMI 31.78 kg/m  General: Well Developed, well nourished, and in no acute distress.  Neuro/Psych: Alert and oriented x3, extra-ocular muscles intact, able to move all 4 extremities, sensation grossly intact. Skin: Warm and dry, no rashes noted.  Respiratory: Not using accessory muscles, speaking in full sentences, trachea midline.  Cardiovascular: Pulses palpable, no extremity edema. Abdomen: Does not appear distended. MSK:  L-spine nontender. Right hip nontender normal appearing normal motion Numbness worsens with palpation overlying the anterior hip. Lower extremity strength is intact throughout with the exception of mild decreased strength to  knee extension on the right compared to the left 4+/5. This occurs with some mild pain into the anterior thigh.    Results for orders placed or performed in visit on 02/23/17 (from the past 48 hour(s))  VITAMIN D 25 Hydroxy (Vit-D Deficiency, Fractures)     Status: Abnormal   Collection Time: 02/23/17  4:29 PM  Result Value Ref Range   Vit D, 25-Hydroxy 18 (L) 30 - 100 ng/mL    Comment: Vitamin D Status           25-OH Vitamin D        Deficiency                <20 ng/mL        Insufficiency         20 - 29 ng/mL        Optimal             > or = 30 ng/mL   For 25-OH Vitamin D testing on patients on D2-supplementation and patients for whom quantitation of D2 and D3 fractions is required, the Quest[employer]ureD 25-OH VIT D, (D2,D3), LC/MS/MS is recommended: order code 16109 (patients > 2 yrs).    No results found.    Assessment and Plan: 26 y.o. female with  Right lateral thigh numbness very likely lateral femoral cutaneous nerve impingement due to pregnancy. This does not explain her mild weakness. I think she has developed a odd gait and developed quadriceps weakness. I'm doubtful for significant  neural impingement. Regardless we'll send to physical therapy for further quad strengthening. Workup home exercise program.  Recheck as needed.    No orders of the defined types were placed in this encounter.  No orders of the defined types were placed in this encounter.   Discussed warning signs or symptoms. Please see discharge instructions. Patient expresses understanding.

## 2017-02-25 ENCOUNTER — Telehealth: Payer: Self-pay | Admitting: *Deleted

## 2017-02-25 DIAGNOSIS — E559 Vitamin D deficiency, unspecified: Secondary | ICD-10-CM

## 2017-02-25 LAB — CULTURE, URINE COMPREHENSIVE: ORGANISM ID, BACTERIA: NO GROWTH

## 2017-02-25 MED ORDER — VITAMIN D (ERGOCALCIFEROL) 1.25 MG (50000 UNIT) PO CAPS: 50000.0000 [IU] | ORAL_CAPSULE | ORAL | 0 refills | Status: DC

## 2017-02-25 MED FILL — VIT D2 1.25 MG (50,000 UNIT: 1.25 MG | 56 days supply | Qty: 8 | Fill #0

## 2017-02-25 NOTE — Telephone Encounter (Signed)
-----   Message from Lesly DukesKelly H Leggett, MD sent at 02/25/2017 12:22 PM EDT ----- Since the first note to the wrong pool. Patient needs 50,000 units of vitamin D weekly for 8 weeks. Patient needs a recheck a vitamin D level in 8 weeks.

## 2017-02-25 NOTE — Telephone Encounter (Signed)
Pt notified of low Vitamin D level and RX for Vitamin D 50K sent to [facility] per Dr Penne LashLeggett  Recheck levels in 8 weeks

## 2017-03-09 ENCOUNTER — Ambulatory Visit (HOSPITAL_COMMUNITY)
Admission: RE | Admit: 2017-03-09 | Discharge: 2017-03-09 | Disposition: A | Discharge status: Home / Self Care | Payer: 59 | Source: Ambulatory Visit | Attending: Advanced Practice Midwife | Admitting: Advanced Practice Midwife

## 2017-03-09 ENCOUNTER — Other Ambulatory Visit: Payer: Self-pay | Admitting: Advanced Practice Midwife

## 2017-03-09 DIAGNOSIS — O09219 Supervision of pregnancy with history of pre-term labor, unspecified trimester: Secondary | ICD-10-CM | POA: Insufficient documentation

## 2017-03-09 DIAGNOSIS — O99212 Obesity complicating pregnancy, second trimester: Secondary | ICD-10-CM | POA: Insufficient documentation

## 2017-03-09 DIAGNOSIS — Z363 Encounter for antenatal screening for malformations: Secondary | ICD-10-CM | POA: Diagnosis not present

## 2017-03-09 DIAGNOSIS — O09899 Supervision of other high risk pregnancies, unspecified trimester: Secondary | ICD-10-CM

## 2017-03-09 DIAGNOSIS — Z3A19 19 weeks gestation of pregnancy: Secondary | ICD-10-CM | POA: Insufficient documentation

## 2017-03-11 ENCOUNTER — Encounter: Payer: 59 | Admitting: Obstetrics & Gynecology

## 2017-03-18 ENCOUNTER — Ambulatory Visit (INDEPENDENT_AMBULATORY_CARE_PROVIDER_SITE_OTHER): Payer: 59 | Admitting: Obstetrics & Gynecology

## 2017-03-18 VITALS — BP 116/71 | HR 83 | Wt 190.0 lb

## 2017-03-18 DIAGNOSIS — O0990 Supervision of high risk pregnancy, unspecified, unspecified trimester: Secondary | ICD-10-CM

## 2017-03-18 DIAGNOSIS — O0992 Supervision of high risk pregnancy, unspecified, second trimester: Secondary | ICD-10-CM

## 2017-03-18 NOTE — Progress Notes (Signed)
   PRENATAL VISIT NOTE  Subjective:  Paula Huff is a 26 y.o. G3P0202 at 5631w5d being seen today for ongoing prenatal care.  She is currently monitored for the following issues for this low-risk pregnancy and has Vitamin D deficiency; Class 1 obesity without serious comorbidity with body mass index (BMI) of 30.0 to 30.9 in adult; Supervision of high risk pregnancy, antepartum; History of preterm delivery, currently pregnant; History of abnormal cervical Pap smear; Cervical cyst; Subchorionic hemorrhage; Meralgia paraesthetica, right; and Quadriceps weakness on her problem list.  Patient reports complains of what sounds like round ligament pain. Reassurance given..  Contractions: Irritability. Vag. Bleeding: None.  Movement: Present. Denies leaking of fluid.   The following portions of the patient's history were reviewed and updated as appropriate: allergies, current medications, past family history, past medical history, past social history, past surgical history and problem list. Problem list updated.  Objective:   Vitals:   03/18/17 1106  BP: 116/71  Pulse: 83  Weight: 190 lb (86.2 kg)    Fetal Status: Fetal Heart Rate (bpm): 140   Movement: Present     General:  Alert, oriented and cooperative. Patient is in no acute distress.  Skin: Skin is warm and dry. No rash noted.   Cardiovascular: Normal heart rate noted  Respiratory: Normal respiratory effort, no problems with respiration noted  Abdomen: Soft, gravid, appropriate for gestational age.  Pain/Pressure: Absent     Pelvic: Cervical exam performed        Extremities: Normal range of motion.  Edema: None  Mental Status:  Normal mood and affect. Normal behavior. Normal judgment and thought content.   Assessment and Plan:  Pregnancy: L2G4010G3P0202 at 7231w5d  1. Supervision of high risk pregnancy, antepartum   Preterm labor symptoms and general obstetric precautions including but not limited to vaginal bleeding, contractions, leaking of  fluid and fetal movement were reviewed in detail with the patient. Please refer to After Visit Summary for other counseling recommendations.  Return in about 4 weeks (around 04/15/2017).   Allie BossierMyra C Tae Robak, MD

## 2017-03-23 ENCOUNTER — Encounter: Payer: Self-pay | Admitting: Obstetrics & Gynecology

## 2017-03-23 ENCOUNTER — Other Ambulatory Visit: Payer: Self-pay | Admitting: *Deleted

## 2017-03-23 DIAGNOSIS — Z3480 Encounter for supervision of other normal pregnancy, unspecified trimester: Secondary | ICD-10-CM

## 2017-03-24 ENCOUNTER — Encounter: Payer: Self-pay | Admitting: Family Medicine

## 2017-03-24 LAB — HEMOGLOBIN A1C: HEMOGLOBIN A1C: 5

## 2017-04-14 ENCOUNTER — Ambulatory Visit (HOSPITAL_COMMUNITY): Payer: 59

## 2017-04-15 ENCOUNTER — Ambulatory Visit (INDEPENDENT_AMBULATORY_CARE_PROVIDER_SITE_OTHER): Payer: 59 | Admitting: Obstetrics & Gynecology

## 2017-04-15 VITALS — BP 116/72 | HR 86 | Wt 195.0 lb

## 2017-04-15 DIAGNOSIS — O0992 Supervision of high risk pregnancy, unspecified, second trimester: Secondary | ICD-10-CM

## 2017-04-15 DIAGNOSIS — E559 Vitamin D deficiency, unspecified: Secondary | ICD-10-CM

## 2017-04-15 DIAGNOSIS — O0990 Supervision of high risk pregnancy, unspecified, unspecified trimester: Secondary | ICD-10-CM

## 2017-04-15 DIAGNOSIS — N888 Other specified noninflammatory disorders of cervix uteri: Secondary | ICD-10-CM

## 2017-04-15 NOTE — Progress Notes (Signed)
   PRENATAL VISIT NOTE  Subjective:  Paula Huff is a 26 y.o. [id number] at 4834w5d being seen today for ongoing prenatal care.  She is currently monitored for the following issues for this high-risk pregnancy and has Vitamin D deficiency; Class 1 obesity without serious comorbidity with body mass index (BMI) of 30.0 to 30.9 in adult; Supervision of high risk pregnancy, antepartum; History of preterm delivery, currently pregnant; History of abnormal cervical Pap smear; Cervical cyst; Subchorionic hemorrhage; Meralgia paraesthetica, right; and Quadriceps weakness on her problem list.  Patient reports no complaints.  Contractions: Not present. Vag. Bleeding: None.  Movement: Present. Denies leaking of fluid.   The following portions of the patient's history were reviewed and updated as appropriate: allergies, current medications, past family history, past medical history, past social history, past surgical history and problem list. Problem list updated.  Objective:   Vitals:   04/15/17 1113  BP: 116/72  Pulse: 86  Weight: 195 lb (88.5 kg)    Fetal Status: Fetal Heart Rate (bpm): 147   Movement: Present     General:  Alert, oriented and cooperative. Patient is in no acute distress.  Skin: Skin is warm and dry. No rash noted.   Cardiovascular: Normal heart rate noted  Respiratory: Normal respiratory effort, no problems with respiration noted  Abdomen: Soft, gravid, appropriate for gestational age.  Pain/Pressure: Present     Pelvic: Cervical exam deferred        Extremities: Normal range of motion.  Edema: None  Mental Status:  Normal mood and affect. Normal behavior. Normal judgment and thought content.   Assessment and Plan:  Pregnancy: A5W0981G3P0202 at 5334w5d  1. Vitamin D deficiency - Vit D leverl - VITAMIN D 25 Hydroxy (Vit-D Deficiency, Fractures)  2. Supervision of high risk pregnancy, antepartum -GTT next visit, tdap, 28 week labs  Preterm labor symptoms and general obstetric  precautions including but not limited to vaginal bleeding, contractions, leaking of fluid and fetal movement were reviewed in detail with the patient. Please refer to After Visit Summary for other counseling recommendations.   RTC 4 weeks  Elsie LincolnKelly Calyse Murcia, MD

## 2017-04-16 ENCOUNTER — Telehealth: Payer: Self-pay | Admitting: *Deleted

## 2017-04-16 LAB — VITAMIN D 25 HYDROXY (VIT D DEFICIENCY, FRACTURES): Vit D, 25-Hydroxy: 42 ng/mL (ref 30–100)

## 2017-04-16 NOTE — Telephone Encounter (Signed)
Pt notified of normal Vitamin D Level of 42

## 2017-04-17 ENCOUNTER — Ambulatory Visit (INDEPENDENT_AMBULATORY_CARE_PROVIDER_SITE_OTHER): Payer: 59 | Admitting: Physician Assistant

## 2017-04-17 ENCOUNTER — Ambulatory Visit (HOSPITAL_COMMUNITY)
Admission: RE | Admit: 2017-04-17 | Discharge: 2017-04-17 | Disposition: A | Discharge status: Home / Self Care | Payer: 59 | Source: Ambulatory Visit | Attending: Advanced Practice Midwife | Admitting: Advanced Practice Midwife

## 2017-04-17 ENCOUNTER — Other Ambulatory Visit: Payer: Self-pay | Admitting: Obstetrics & Gynecology

## 2017-04-17 ENCOUNTER — Encounter: Payer: Self-pay | Admitting: Physician Assistant

## 2017-04-17 ENCOUNTER — Telehealth: Payer: Self-pay | Admitting: *Deleted

## 2017-04-17 VITALS — BP 104/73 | HR 105 | Ht 63.0 in | Wt 193.0 lb

## 2017-04-17 DIAGNOSIS — Z362 Encounter for other antenatal screening follow-up: Secondary | ICD-10-CM | POA: Diagnosis not present

## 2017-04-17 DIAGNOSIS — Z3689 Encounter for other specified antenatal screening: Secondary | ICD-10-CM | POA: Diagnosis not present

## 2017-04-17 DIAGNOSIS — Z3A25 25 weeks gestation of pregnancy: Secondary | ICD-10-CM | POA: Diagnosis not present

## 2017-04-17 DIAGNOSIS — M62838 Other muscle spasm: Secondary | ICD-10-CM | POA: Insufficient documentation

## 2017-04-17 DIAGNOSIS — Z6831 Body mass index (BMI) 31.0-31.9, adult: Secondary | ICD-10-CM | POA: Diagnosis not present

## 2017-04-17 DIAGNOSIS — E669 Obesity, unspecified: Secondary | ICD-10-CM | POA: Diagnosis not present

## 2017-04-17 DIAGNOSIS — G43009 Migraine without aura, not intractable, without status migrainosus: Secondary | ICD-10-CM

## 2017-04-17 DIAGNOSIS — O26899 Other specified pregnancy related conditions, unspecified trimester: Secondary | ICD-10-CM | POA: Insufficient documentation

## 2017-04-17 DIAGNOSIS — O09212 Supervision of pregnancy with history of pre-term labor, second trimester: Secondary | ICD-10-CM | POA: Diagnosis not present

## 2017-04-17 DIAGNOSIS — O99212 Obesity complicating pregnancy, second trimester: Secondary | ICD-10-CM | POA: Diagnosis not present

## 2017-04-17 DIAGNOSIS — R51 Headache: Secondary | ICD-10-CM

## 2017-04-17 DIAGNOSIS — Z3480 Encounter for supervision of other normal pregnancy, unspecified trimester: Secondary | ICD-10-CM

## 2017-04-17 DIAGNOSIS — R519 Headache, unspecified: Secondary | ICD-10-CM | POA: Insufficient documentation

## 2017-04-17 DIAGNOSIS — O26892 Other specified pregnancy related conditions, second trimester: Secondary | ICD-10-CM

## 2017-04-17 DIAGNOSIS — E559 Vitamin D deficiency, unspecified: Secondary | ICD-10-CM

## 2017-04-17 MED ORDER — BUTALBITAL-APAP-CAFFEINE 50-325-40 MG PO TABS: 1.0000 | ORAL_TABLET | Freq: Four times a day (QID) | ORAL | 0 refills | Status: DC | PRN

## 2017-04-17 MED ORDER — CYCLOBENZAPRINE HCL 10 MG PO TABS: 10.0000 mg | ORAL_TABLET | Freq: Three times a day (TID) | ORAL | 1 refills | Status: DC | PRN

## 2017-04-17 MED ORDER — VITAMIN D (CHOLECALCIFEROL) 25 MCG (1000 UT) PO CAPS: 1.0000 | ORAL_CAPSULE | Freq: Every day | ORAL | 6 refills | Status: DC

## 2017-04-17 MED FILL — CYCLOBENZAPRINE 10 MG TAB: 10 | 10 days supply | Qty: 30 | Fill #0

## 2017-04-17 MED FILL — VITAMIN D 1,000 UNITS TAB: 25 MCG | 100 days supply | Qty: 100 | Fill #0

## 2017-04-17 NOTE — Patient Instructions (Signed)

## 2017-04-17 NOTE — Telephone Encounter (Signed)
LM on voicemail to D/C the 50K Vitamin D and to start 1000 mg daily.  This was sent to [facility] pharmacy.

## 2017-04-17 NOTE — Telephone Encounter (Signed)
-----   Message from Lesly Dukes, MD sent at 04/17/2017  6:41 AM EDT ----- Pt should sto pthe 50K unit pills.  Pt should now take 1000 units per day.  RN to prescribe and call pt.

## 2017-04-17 NOTE — Progress Notes (Signed)
History:  Paula Huff is a 26 y.o. [id number] who presents to clinic today for headache eval.  She has had them since her adulthood but they are markedly worse this pregnancy.  She has had to leave work with HA, dizziness.  The HA can be severe, with throbbing, located right frontal, worse with movement.  They are worse with light and noise.  There can be nausea but no vomiting.  Also dizziness.  It is a constant dull HA than then progresses.  The dull HA does go away at times.  There are bright stars with the HA. Green tea helps some.  She uses tylenol with limited benefit  When she was able to use ibuprofen, that was helpful but she has been told not to do so.   In other pregnancies, she has seen improvement with nursing afterward.  HIT6:60 Number of days in the last 4 weeks with:  Severe headache: 0 Moderate headache: 5 Mild headache: 23  No headache: 0   Past Medical History:  Diagnosis Date  . Anxiety   . Depression   . GERD (gastroesophageal reflux disease)   . History of preterm labor   . Insomnia   . Medical history non-contributory   . Palpitations   . Prediabetes   . Vaginal Pap smear, abnormal     Social History   Social History  . Marital status: Single    Spouse name: N/A  . Number of children: N/A  . Years of education: N/A   Occupational History  . [employer]   Social History Main Topics  . Smoking status: Never Smoker  . Smokeless tobacco: Never Used  . Alcohol use No  . Drug use: No  . Sexual activity: Yes    Birth control/ protection: None   Other Topics Concern  . Not on file   Social History Narrative  . No narrative on file    Family History  Problem Relation Age of Onset  . Hypertension Mother   . Depression Mother   . Anxiety disorder Mother   . Diabetes Father   . Obesity Father   . Hypothyroidism Sister   . Diabetes Brother   . Diabetes Maternal Grandmother     No Known Allergies  Current Outpatient Prescriptions on File  Prior to Visit  Medication Sig Dispense Refill  . Prenatal Vit-Fe Fumarate-FA (PRENATAL VITAMIN PO) Take by mouth.     No current facility-administered medications on file prior to visit.      Review of Systems:  All pertinent positive/negative included in HPI, all other review of systems are negative   Objective:  Physical Exam BP 104/73   Pulse (!) 105   Ht 5\' 3"  (1.6 m)   Wt 193 lb (87.5 kg)   LMP 10/24/2016   Breastfeeding? No   BMI 34.19 kg/m  CONSTITUTIONAL: Well-developed, well-nourished female in no acute distress.  EYES: EOM intact ENT: Normocephalic CARDIOVASCULAR: Slightly elevated rate  RESPIRATORY: Normal rate.  MUSCULOSKELETAL: Normal ROM, strength equal bilaterally, significant bilat trap muscle spasm noted SKIN: Warm, dry without erythema  NEUROLOGICAL: Alert, oriented, CN II-XII grossly intact, Appropriate balance PSYCH: Normal behavior, mood   Assessment & Plan:  Assessment:  1. Pregnancy headache, antepartum   2. Migraine without aura and without status migrainosus, not intractable   3. Muscle spasm      Plan: Flexeril - use in the evening, break in half as needed - sedation precautions given - this is to be used for dull  HA/muscle spasm: max dose of 3 tabs per day as tolerated Fioricet - rescue med when needed.  Limit use to 2 days per week or less Biofreeze/ice/heat/massage/acupuncture all discussed and encouraged to use as appropriate TPI/ONB was discussed.  Pt declining at this time If these are not effective, other options to employ in the future would be phenergan or reglan, benadryl, pred-pack if intractable.   Follow-up  PRN  Bertram Denver, PA-C 04/17/2017 9:03 AM

## 2017-04-20 MED FILL — BUTALB-ACETAMIN-CAFF 50-325: 50-325-40 | 2 days supply | Qty: 20 | Fill #0

## 2017-04-30 ENCOUNTER — Ambulatory Visit (INDEPENDENT_AMBULATORY_CARE_PROVIDER_SITE_OTHER): Payer: 59 | Admitting: Obstetrics & Gynecology

## 2017-04-30 VITALS — BP 125/72 | HR 101 | Wt 197.0 lb

## 2017-04-30 DIAGNOSIS — Z6830 Body mass index (BMI) 30.0-30.9, adult: Secondary | ICD-10-CM

## 2017-04-30 DIAGNOSIS — E6609 Other obesity due to excess calories: Secondary | ICD-10-CM

## 2017-04-30 DIAGNOSIS — O0990 Supervision of high risk pregnancy, unspecified, unspecified trimester: Secondary | ICD-10-CM

## 2017-04-30 DIAGNOSIS — O0992 Supervision of high risk pregnancy, unspecified, second trimester: Secondary | ICD-10-CM

## 2017-04-30 NOTE — Progress Notes (Signed)
   PRENATAL VISIT NOTE  Subjective:  Paula Huff is a 26 y.o. [id number] at 9874w6d being seen today for ongoing prenatal care.  She is currently monitored for the following issues for this high-risk pregnancy and has Vitamin D deficiency; Class 1 obesity without serious comorbidity with body mass index (BMI) of 30.0 to 30.9 in adult; Supervision of high risk pregnancy, antepartum; History of preterm delivery, currently pregnant; History of abnormal cervical Pap smear; Cervical cyst; Subchorionic hemorrhage; Meralgia paraesthetica, right; Quadriceps weakness; Migraine without aura and without status migrainosus, not intractable; Pregnancy headache, antepartum; and Muscle spasm on her problem list.  Patient reports constant cramping since yesterday and thinks that she lost her mucous plug last night. She delivered her last baby at 36 weeks..  Contractions: Irritability. Vag. Bleeding: None.  Movement: Present. Denies leaking of fluid.   The following portions of the patient's history were reviewed and updated as appropriate: allergies, current medications, past family history, past medical history, past social history, past surgical history and problem list. Problem list updated.  Objective:   Vitals:   04/30/17 1304  BP: 125/72  Pulse: (!) 101  Weight: 197 lb (89.4 kg)    Fetal Status: Fetal Heart Rate (bpm): 143   Movement: Present     General:  Alert, oriented and cooperative. Patient is in no acute distress.  Skin: Skin is warm and dry. No rash noted.   Cardiovascular: Normal heart rate noted  Respiratory: Normal respiratory effort, no problems with respiration noted  Abdomen: Soft, gravid, appropriate for gestational age.  Pain/Pressure: Present     Pelvic: Cervical exam performed        Extremities: Normal range of motion.  Edema: None  Mental Status:  Normal mood and affect. Normal behavior. Normal judgment and thought content.   Assessment and Plan:  Pregnancy: A5W0981G3P0202 at  7974w6d  1. Supervision of high risk pregnancy, antepartum   2. Class 1 obesity due to excess calories without serious comorbidity with body mass index (BMI) of 30.0 to 30.9 in adult   Preterm labor symptoms and general obstetric precautions including but not limited to vaginal bleeding, contractions, leaking of fluid and fetal movement were reviewed in detail with the patient. Please refer to After Visit Summary for other counseling recommendations.  No Follow-up on file.   Paula BossierMyra C Mariellen Blaney, MD

## 2017-04-30 NOTE — Progress Notes (Signed)
Pt states that she thinks she may have lost her plug last night and now having mod cramping H/O preterm labor @ 27w

## 2017-05-07 ENCOUNTER — Ambulatory Visit (INDEPENDENT_AMBULATORY_CARE_PROVIDER_SITE_OTHER): Payer: 59 | Admitting: Obstetrics & Gynecology

## 2017-05-07 ENCOUNTER — Encounter: Payer: Self-pay | Admitting: Family Medicine

## 2017-05-07 VITALS — BP 110/72 | HR 107 | Wt 196.0 lb

## 2017-05-07 DIAGNOSIS — O09219 Supervision of pregnancy with history of pre-term labor, unspecified trimester: Secondary | ICD-10-CM

## 2017-05-07 DIAGNOSIS — O09212 Supervision of pregnancy with history of pre-term labor, second trimester: Secondary | ICD-10-CM

## 2017-05-07 DIAGNOSIS — Z3482 Encounter for supervision of other normal pregnancy, second trimester: Secondary | ICD-10-CM

## 2017-05-07 DIAGNOSIS — O09899 Supervision of other high risk pregnancies, unspecified trimester: Secondary | ICD-10-CM

## 2017-05-07 DIAGNOSIS — O0990 Supervision of high risk pregnancy, unspecified, unspecified trimester: Secondary | ICD-10-CM

## 2017-05-07 DIAGNOSIS — O0992 Supervision of high risk pregnancy, unspecified, second trimester: Secondary | ICD-10-CM

## 2017-05-07 NOTE — Progress Notes (Signed)
Pt on BRX but saw Dr Marice Potterove last week due to her h/o PTL and was told to return today by Dr Marice Potterove.  Will do 28 week labs next week    PRENATAL VISIT NOTE  Subjective:  Paula Huff is a 26 y.o. [id number] at 6712w6d being seen today for ongoing prenatal care.  She is currently monitored for the following issues for this high-risk pregnancy and has Vitamin D deficiency; Class 1 obesity without serious comorbidity with body mass index (BMI) of 30.0 to 30.9 in adult; Supervision of high risk pregnancy, antepartum; History of preterm delivery, currently pregnant; History of abnormal cervical Pap smear; Cervical cyst; Subchorionic hemorrhage; Meralgia paraesthetica, right; Quadriceps weakness; Migraine without aura and without status migrainosus, not intractable; Pregnancy headache, antepartum; and Muscle spasm on her problem list.  Patient reports vaginal pressure (not changed from past weeks).  Contractions: Irritability. Vag. Bleeding: None.  Movement: Present. Denies leaking of fluid.   The following portions of the patient's history were reviewed and updated as appropriate: allergies, current medications, past family history, past medical history, past social history, past surgical history and problem list. Problem list updated.  Objective:   Vitals:   05/07/17 1303  BP: 110/72  Pulse: (!) 107  Weight: 196 lb (88.9 kg)    Fetal Status: Fetal Heart Rate (bpm): 141   Movement: Present     General:  Alert, oriented and cooperative. Patient is in no acute distress.  Skin: Skin is warm and dry. No rash noted.   Cardiovascular: Normal heart rate noted  Respiratory: Normal respiratory effort, no problems with respiration noted  Abdomen: Soft, gravid, appropriate for gestational age.  Pain/Pressure: Present     Pelvic: Cervical exam deferred      Pt declines  Extremities: Normal range of motion.  Edema: None  Mental Status:  Normal mood and affect. Normal behavior. Normal judgment and thought content.    Assessment and Plan:  Pregnancy: A5W0981G3P0202 at 5812w6d  1. Encounter for supervision of other normal pregnancy in second trimester 28 week labs next week.  3. History of preterm delivery, currently pregnant Declines cervical exam.  Pressure is no different from previoius weeks.  NO discharge or bleeding or LOF.  PTL precautions given.  Pt works [employer] and come by for cervical exam any time.    Preterm labor symptoms and general obstetric precautions including but not limited to vaginal bleeding, contractions, leaking of fluid and fetal movement were reviewed in detail with the patient. Please refer to After Visit Summary for other counseling recommendations.  Return in about 2 weeks (around 05/21/2017).   Elsie LincolnKelly Soham Hollett, MD

## 2017-05-13 ENCOUNTER — Other Ambulatory Visit (INDEPENDENT_AMBULATORY_CARE_PROVIDER_SITE_OTHER): Payer: 59

## 2017-05-13 ENCOUNTER — Encounter: Payer: 59 | Admitting: Obstetrics & Gynecology

## 2017-05-13 DIAGNOSIS — Z3480 Encounter for supervision of other normal pregnancy, unspecified trimester: Secondary | ICD-10-CM

## 2017-05-13 DIAGNOSIS — Z23 Encounter for immunization: Secondary | ICD-10-CM | POA: Diagnosis not present

## 2017-05-13 NOTE — Progress Notes (Signed)
Patient presents for two gtt and tdap injection. Armandina StammerJennifer Daniela Hernan RNBSN

## 2017-05-14 LAB — CBC
HCT: 34.9 % — ABNORMAL LOW (ref 35.0–45.0)
HEMOGLOBIN: 11.6 g/dL — AB (ref 11.7–15.5)
MCH: 27.7 pg (ref 27.0–33.0)
MCHC: 33.2 g/dL (ref 32.0–36.0)
MCV: 83.3 fL (ref 80.0–100.0)
MPV: 10.3 fL (ref 7.5–12.5)
PLATELETS: 358 10*3/uL (ref 140–400)
RBC: 4.19 10*6/uL (ref 3.80–5.10)
RDW: 13.6 % (ref 11.0–15.0)
WBC: 8.4 10*3/uL (ref 3.8–10.8)

## 2017-05-14 LAB — HIV ANTIBODY (ROUTINE TESTING W REFLEX): HIV: NONREACTIVE

## 2017-05-14 LAB — RPR: RPR Ser Ql: NONREACTIVE

## 2017-05-14 LAB — 2HR GTT W 1 HR, CARPENTER, 75 G
GLUCOSE, 1 HR, GEST: 130 mg/dL (ref 65–179)
GLUCOSE, FASTING, GEST: 77 mg/dL (ref 65–91)
Glucose, 2 Hr, Gest: 112 mg/dL (ref 65–152)

## 2017-05-19 ENCOUNTER — Encounter: Payer: Self-pay | Admitting: Obstetrics & Gynecology

## 2017-05-22 ENCOUNTER — Ambulatory Visit (INDEPENDENT_AMBULATORY_CARE_PROVIDER_SITE_OTHER): Payer: 59 | Admitting: Advanced Practice Midwife

## 2017-05-22 VITALS — BP 113/70 | HR 87 | Wt 200.0 lb

## 2017-05-22 DIAGNOSIS — Z308 Encounter for other contraceptive management: Secondary | ICD-10-CM | POA: Insufficient documentation

## 2017-05-22 DIAGNOSIS — O0990 Supervision of high risk pregnancy, unspecified, unspecified trimester: Secondary | ICD-10-CM

## 2017-05-22 NOTE — Patient Instructions (Signed)

## 2017-05-22 NOTE — Progress Notes (Signed)
Routine

## 2017-05-22 NOTE — Progress Notes (Signed)
   PRENATAL VISIT NOTE  Subjective:  Paula Huff is a 26 y.o. [id number] at [redacted]w[redacted]d being seen today for ongoing prenatal care.  She is currently monitored for the following issues for this high-risk pregnancy and has Vitamin D deficiency; Class 1 obesity without serious comorbidity with body mass index (BMI) of 30.0 to 30.9 in adult; Supervision of high risk pregnancy, antepartum; History of preterm delivery, currently pregnant; History of abnormal cervical Pap smear; Cervical cyst; Subchorionic hemorrhage; Meralgia paraesthetica, right; Quadriceps weakness; Migraine without aura and without status migrainosus, not intractable; Pregnancy headache, antepartum; and Muscle spasm on her problem list.  Patient reports no complaints.  Contractions: Irritability. Vag. Bleeding: None.  Movement: Present. Denies leaking of fluid.    Patient states that she saw Dr. Marice Potter on 8/30 because she was having some mucous discharge. She states that she has had that occasionally, but it has gotten better since that visit. She denies any bleeding or pain. She reports that she has about 4 contractions per day, and that this is unchanged as well.    The following portions of the patient's history were reviewed and updated as appropriate: allergies, current medications, past family history, past medical history, past social history, past surgical history and problem list. Problem list updated.  Objective:   Vitals:   05/22/17 1104  BP: 113/70  Pulse: 87  Weight: 200 lb (90.7 kg)    Fetal Status:     Movement: Present   FHR: 141 with doppler FH:   General:  Alert, oriented and cooperative. Patient is in no acute distress.  Skin: Skin is warm and dry. No rash noted.   Cardiovascular: Normal heart rate noted  Respiratory: Normal respiratory effort, no problems with respiration noted  Abdomen: Soft, gravid, appropriate for gestational age.  Pain/Pressure: Present     Pelvic: Cervical exam deferred        Extremities:  Normal range of motion.  Edema: None  Mental Status:  Normal mood and affect. Normal behavior. Normal judgment and thought content.   Assessment and Plan:  Pregnancy: G2X5284 at [redacted]w[redacted]d  1. Supervision of high risk pregnancy, antepartum - Routine care - preterm labor information  - desires BTL postpartum, has cone [employer].  - flu vaccine today  -Strict labor precautions reviewed with the patient.   Preterm labor symptoms and general obstetric precautions including but not limited to vaginal bleeding, contractions, leaking of fluid and fetal movement were reviewed in detail with the patient. Please refer to After Visit Summary for other counseling recommendations.  Return in about 2 weeks (around 06/05/2017).   Thressa Sheller, CNM

## 2017-06-05 ENCOUNTER — Ambulatory Visit (INDEPENDENT_AMBULATORY_CARE_PROVIDER_SITE_OTHER): Payer: 59 | Admitting: Advanced Practice Midwife

## 2017-06-05 VITALS — BP 113/67 | HR 81 | Wt 196.0 lb

## 2017-06-05 DIAGNOSIS — R350 Frequency of micturition: Secondary | ICD-10-CM | POA: Diagnosis not present

## 2017-06-05 DIAGNOSIS — O09899 Supervision of other high risk pregnancies, unspecified trimester: Secondary | ICD-10-CM

## 2017-06-05 DIAGNOSIS — O0990 Supervision of high risk pregnancy, unspecified, unspecified trimester: Secondary | ICD-10-CM

## 2017-06-05 DIAGNOSIS — Z8742 Personal history of other diseases of the female genital tract: Secondary | ICD-10-CM

## 2017-06-05 DIAGNOSIS — Z87898 Personal history of other specified conditions: Secondary | ICD-10-CM

## 2017-06-05 DIAGNOSIS — O09219 Supervision of pregnancy with history of pre-term labor, unspecified trimester: Secondary | ICD-10-CM

## 2017-06-05 DIAGNOSIS — O09213 Supervision of pregnancy with history of pre-term labor, third trimester: Secondary | ICD-10-CM

## 2017-06-05 DIAGNOSIS — O0993 Supervision of high risk pregnancy, unspecified, third trimester: Secondary | ICD-10-CM

## 2017-06-05 NOTE — Addendum Note (Signed)
Addended by: Granville Lewis on: 06/05/2017 12:10 PM   Modules accepted: Orders

## 2017-06-05 NOTE — Patient Instructions (Signed)

## 2017-06-05 NOTE — Addendum Note (Signed)
Addended by: Dorathy Kinsman on: 06/05/2017 12:08 PM   Modules accepted: Orders

## 2017-06-05 NOTE — Progress Notes (Signed)
   PRENATAL VISIT NOTE  Subjective:  Paula Huff is a 26 y.o. Z6X0960 at [redacted]w[redacted]d being seen today for ongoing prenatal care.  She is currently monitored for the following issues for this high-risk pregnancy and has Vitamin D deficiency; Class 1 obesity without serious comorbidity with body mass index (BMI) of 30.0 to 30.9 in adult; Supervision of high risk pregnancy, antepartum; History of preterm delivery, currently pregnant; History of abnormal cervical Pap smear; Cervical cyst; Subchorionic hemorrhage; Meralgia paraesthetica, right; Quadriceps weakness; Migraine without aura and without status migrainosus, not intractable; Pregnancy headache, antepartum; Muscle spasm; and Desires tubal ligation  on her problem list.  Patient reports occasional contractions and significant vaginal pain at night and increased urinary frequency. denies Henaturia, vaginal bleeding, vaginal discharge, LOF.  Contractions: Irritability. Vag. Bleeding: None.  Movement: Present. Denies leaking of fluid.   The following portions of the patient's history were reviewed and updated as appropriate: allergies, current medications, past family history, past medical history, past social history, past surgical history and problem list. Problem list updated.  Objective:   Vitals:   06/05/17 1021  BP: 113/67  Pulse: 81  Weight: 196 lb (88.9 kg)    Fetal Status: Fetal Heart Rate (bpm): 143 Fundal Height: 32 cm Movement: Present     General:  Alert, oriented and cooperative. Patient is in no acute distress.  Skin: Skin is warm and dry. No rash noted.   Cardiovascular: Normal heart rate noted  Respiratory: Normal respiratory effort, no problems with respiration noted  Abdomen: Soft, gravid, appropriate for gestational age.  Pain/Pressure: Present     Pelvic: Cervical exam performed Dilation: Closed Effacement (%): 0 Station: Ballotable  Extremities: Normal range of motion.  Edema: None  Mental Status:  Normal mood and affect.  Normal behavior. Normal judgment and thought content.   Assessment and Plan:  Pregnancy: A5W0981 at [redacted]w[redacted]d  Hx PTD - Previously declined 17-P - No evidence of PTL currently  Supervision other high risk pregnancy  Vaginal pain of uncertain etiology - Urine Culture sent - PTL precautions - Comfort measures.   Preterm labor symptoms and general obstetric precautions including but not limited to vaginal bleeding, contractions, leaking of fluid and fetal movement were reviewed in detail with the patient. Please refer to After Visit Summary for other counseling recommendations.  F/U 2 weeks   Dorathy Kinsman, [place]

## 2017-06-09 ENCOUNTER — Encounter (INDEPENDENT_AMBULATORY_CARE_PROVIDER_SITE_OTHER): Payer: Self-pay | Admitting: *Deleted

## 2017-06-09 ENCOUNTER — Encounter: Payer: Self-pay | Admitting: *Deleted

## 2017-06-09 DIAGNOSIS — Z029 Encounter for administrative examinations, unspecified: Secondary | ICD-10-CM

## 2017-06-09 LAB — CULTURE, URINE COMPREHENSIVE
MICRO NUMBER:: 81110240
SPECIMEN QUALITY:: ADEQUATE

## 2017-06-19 ENCOUNTER — Ambulatory Visit (INDEPENDENT_AMBULATORY_CARE_PROVIDER_SITE_OTHER): Payer: 59 | Admitting: Obstetrics & Gynecology

## 2017-06-19 VITALS — BP 110/69 | HR 101 | Wt 198.0 lb

## 2017-06-19 DIAGNOSIS — O09213 Supervision of pregnancy with history of pre-term labor, third trimester: Secondary | ICD-10-CM

## 2017-06-19 DIAGNOSIS — O09219 Supervision of pregnancy with history of pre-term labor, unspecified trimester: Principal | ICD-10-CM

## 2017-06-19 DIAGNOSIS — O09899 Supervision of other high risk pregnancies, unspecified trimester: Secondary | ICD-10-CM

## 2017-06-19 NOTE — Progress Notes (Signed)
Noticed clear nipple discharge last night.    PRENATAL VISIT NOTE  Subjective:  Paula Huff is a 26 y.o. [id number] at 6529w0d being seen today for ongoing prenatal care.  She is currently monitored for the following issues for this high-risk pregnancy and has Vitamin D deficiency; Class 1 obesity without serious comorbidity with body mass index (BMI) of 30.0 to 30.9 in adult; Supervision of high risk pregnancy, antepartum; History of preterm delivery, currently pregnant; History of abnormal cervical Pap smear; Cervical cyst; Subchorionic hemorrhage; Meralgia paraesthetica, right; Quadriceps weakness; Migraine without aura and without status migrainosus, not intractable; Pregnancy headache, antepartum; Muscle spasm; and Desires tubal ligation  on her problem list.  Patient reports clear nipple discharge.  Contractions: Irritability. Vag. Bleeding: None.  Movement: Present. Denies leaking of fluid.   The following portions of the patient's history were reviewed and updated as appropriate: allergies, current medications, past family history, past medical history, past social history, past surgical history and problem list. Problem list updated.  Objective:   Vitals:   06/19/17 1058  BP: 110/69  Pulse: (!) 101  Weight: 198 lb (89.8 kg)    Fetal Status: Fetal Heart Rate (bpm): 153 Fundal Height: 34 cm Movement: Present     General:  Alert, oriented and cooperative. Patient is in no acute distress.  Skin: Skin is warm and dry. No rash noted.   Cardiovascular: Normal heart rate noted  Respiratory: Normal respiratory effort, no problems with respiration noted  Abdomen: Soft, gravid, appropriate for gestational age.  Pain/Pressure: Present     Pelvic: Cervical exam deferred        Extremities: Normal range of motion.  Edema: None  Mental Status:  Normal mood and affect. Normal behavior. Normal judgment and thought content.   Assessment and Plan:  Pregnancy: W4X3244G3P0202 at 6329w0d  1. History of  preterm delivery, currently pregnant No regular contractions or vaginal pressure.    Preterm labor symptoms and general obstetric precautions including but not limited to vaginal bleeding, contractions, leaking of fluid and fetal movement were reviewed in detail with the patient. Please refer to After Visit Summary for other counseling recommendations.  Return in about 2 weeks (around 07/03/2017).   Elsie LincolnKelly Jago Carton, MD

## 2017-07-03 ENCOUNTER — Ambulatory Visit (INDEPENDENT_AMBULATORY_CARE_PROVIDER_SITE_OTHER): Payer: 59 | Admitting: Advanced Practice Midwife

## 2017-07-03 VITALS — BP 110/74 | HR 107 | Wt 203.0 lb

## 2017-07-03 DIAGNOSIS — O09219 Supervision of pregnancy with history of pre-term labor, unspecified trimester: Secondary | ICD-10-CM

## 2017-07-03 DIAGNOSIS — O0990 Supervision of high risk pregnancy, unspecified, unspecified trimester: Secondary | ICD-10-CM

## 2017-07-03 DIAGNOSIS — Z113 Encounter for screening for infections with a predominantly sexual mode of transmission: Secondary | ICD-10-CM | POA: Diagnosis not present

## 2017-07-03 DIAGNOSIS — O09899 Supervision of other high risk pregnancies, unspecified trimester: Secondary | ICD-10-CM

## 2017-07-03 DIAGNOSIS — Z3483 Encounter for supervision of other normal pregnancy, third trimester: Secondary | ICD-10-CM

## 2017-07-03 DIAGNOSIS — O09213 Supervision of pregnancy with history of pre-term labor, third trimester: Secondary | ICD-10-CM

## 2017-07-03 DIAGNOSIS — O0993 Supervision of high risk pregnancy, unspecified, third trimester: Secondary | ICD-10-CM

## 2017-07-03 NOTE — Patient Instructions (Addendum)
Considering Waterbirth? Guide for patients at [facility]  Why consider waterbirth?  . Gentle birth for babies . Less pain medicine used in labor . May allow for passive descent/less pushing . May reduce perineal tears  . More mobility and instinctive maternal position changes . Increased maternal relaxation . Reduced blood pressure in labor  Is waterbirth safe? What are the risks of infection, drowning or other complications?  . Infection: o Very low risk (3.7 % for tub vs 4.8% for bed) o 7 in 8000 waterbirths with documented infection o Poorly cleaned equipment most common cause o Slightly lower group B strep transmission rate  . Drowning o Maternal:  - Very low risk   - Related to seizures or fainting o Newborn:  - Very low risk. No evidence of increased risk of respiratory problems in multiple large studies - Physiological protection from breathing under water - Avoid underwater birth if there are any fetal complications - Once baby's head is out of the water, keep it out.  . Birth complication o Some reports of cord trauma, but risk decreased by bringing baby to surface gradually o No evidence of increased risk of shoulder dystocia. Mothers can usually change positions faster in water than in a bed, possibly aiding the maneuvers to free the shoulder.   You must attend a Doren Custard class at [facility]  3rd Wednesday of every month from 7-9pm  [employer] by calling [phone] or online at [email or web address]  Bring [place] the certificate from the class to your prenatal appointment  Meet with a midwife at 36 weeks to see if you can still plan a waterbirth and to sign the consent.   Purchase or rent the following supplies:   Water Birth Pool (Birth Pool in a Box or [place] for instance)  (Tubs start ~$125)  Single-use disposable tub liner designed for your brand of tub  New garden hose labeled "lead-free", "suitable for drinking  water",  Electric drain pump to remove water (We recommend 792 gallon per hour or greater pump.)   Separate garden hose to remove the dirty water  Fish net  Bathing suit top (optional)  Long-handled mirror (optional)  Places to purchase or rent supplies  GotWeb[employer].is for tub purchases and supplies  Waterbirthsolutions.com for tub purchases and supplies  The Labor Ladies (www.thelaborladies.com) $275 for tub rental/set-up & take down/kit   [employer] (http://www.fleming.com/.htm) Information regarding doulas (labor support) who provide pool rentals  Our practice has a Birth Pool in a Box tub at the hospital that you may borrow on a first-come-first-served basis. It is your responsibility to to set up, clean and break down the tub. We cannot guarantee the availability of this tub in advance. You are responsible for bringing all accessories listed above. If you do not have all necessary supplies you cannot have a waterbirth.    Things that would prevent you from having a waterbirth:  Premature, <37wks  Previous cesarean birth  Presence of thick meconium-stained fluid  Multiple gestation (Twins, triplets, etc.)  Uncontrolled diabetes or gestational diabetes requiring medication  Hypertension requiring medication or diagnosis of pre-eclampsia  Heavy vaginal bleeding  Non-reassuring fetal heart rate  Active infection (MRSA, etc.). Group B Strep is NOT a contraindication for  waterbirth.  If your labor has to be induced and induction method requires continuous  monitoring of the baby's heart rate  Other risks/issues identified by your obstetrical provider  Please remember that birth is unpredictable. Under certain unforeseeable  circumstances your provider may advise against giving birth in the tub. These decisions will be made on a case-by-case basis and with the safety of you and your baby as our highest priority.

## 2017-07-03 NOTE — Progress Notes (Signed)
   PRENATAL VISIT NOTE  Subjective:  Paula Huff is a 26 y.o. [id number] at [redacted]w[redacted]d being seen today for ongoing prenatal care.  She is currently monitored for the following issues for this high-risk pregnancy and has Vitamin D deficiency; Class 1 obesity without serious comorbidity with body mass index (BMI) of 30.0 to 30.9 in adult; Supervision of high risk pregnancy, antepartum; History of preterm delivery, currently pregnant; History of abnormal cervical Pap smear; Cervical cyst; Subchorionic hemorrhage; Meralgia paraesthetica, right; Quadriceps weakness; Migraine without aura and without status migrainosus, not intractable; Pregnancy headache, antepartum; Muscle spasm; and Desires tubal ligation  on her problem list.  Patient reports occasional contractions.  Contractions: Irritability. Vag. Bleeding: None.  Movement: Present. Denies leaking of fluid.   The following portions of the patient's history were reviewed and updated as appropriate: allergies, current medications, past family history, past medical history, past social history, past surgical history and problem list. Problem list updated.  Objective:   Vitals:   07/03/17 1055  BP: 110/74  Pulse: (!) 107  Weight: 203 lb (92.1 kg)    Fetal Status: Fetal Heart Rate (bpm): 144 Fundal Height: 36 cm Movement: Present  Presentation: Vertex  General:  Alert, oriented and cooperative. Patient is in no acute distress.  Skin: Skin is warm and dry. No rash noted.   Cardiovascular: Normal heart rate noted  Respiratory: Normal respiratory effort, no problems with respiration noted  Abdomen: Soft, gravid, appropriate for gestational age.  Pain/Pressure: Present     Pelvic: Cervical exam performed Dilation: 3 Effacement (%): 0 Station: Ballotable 3 cm cyst still palpable possibly originating from posterior right upper vaginal wall.  Extremities: Normal range of motion.  Edema: None  Mental Status:  Normal mood and affect. Normal behavior. Normal  judgment and thought content.   Assessment and Plan:  Pregnancy: [id number] at [redacted]w[redacted]d  1. Encounter for supervision of other normal pregnancy in third trimester  - GC/Chlamydia probe amp ([place] - Culture, beta strep (group b only)  2. Supervision of high risk pregnancy, antepartum  - Inquiring about waterbirth. R/B explained. Doubt she will be able to get into a class in time, but will send In-Basket message to [employer].   3. History of preterm delivery, currently pregnant   Preterm labor symptoms and general obstetric precautions including but not limited to vaginal bleeding, contractions, leaking of fluid and fetal movement were reviewed in detail with the patient. Please refer to After Visit Summary for other counseling recommendations.  Return in about 1 week (around 07/10/2017) for ROB.   Dorathy KinsmanVirginia Zriyah Kopplin, CNM

## 2017-07-05 LAB — CULTURE, BETA STREP (GROUP B ONLY)
MICRO NUMBER:: 81232817
SPECIMEN QUALITY:: ADEQUATE

## 2017-07-06 LAB — GC/CHLAMYDIA PROBE AMP (~~LOC~~) NOT AT ARMC
CHLAMYDIA, DNA PROBE: NEGATIVE
Neisseria Gonorrhea: NEGATIVE

## 2017-07-10 ENCOUNTER — Ambulatory Visit (INDEPENDENT_AMBULATORY_CARE_PROVIDER_SITE_OTHER): Payer: 59 | Admitting: Student

## 2017-07-10 VITALS — BP 119/74 | HR 92 | Wt 203.0 lb

## 2017-07-10 DIAGNOSIS — O0993 Supervision of high risk pregnancy, unspecified, third trimester: Secondary | ICD-10-CM

## 2017-07-10 DIAGNOSIS — O0990 Supervision of high risk pregnancy, unspecified, unspecified trimester: Secondary | ICD-10-CM

## 2017-07-10 NOTE — Patient Instructions (Signed)

## 2017-07-10 NOTE — Progress Notes (Signed)
   PRENATAL VISIT NOTE  Subjective:  Paula Huff is a 26 y.o. [id number] at 470w0d being seen today for ongoing prenatal care.  She is currently monitored for the following issues for this high-risk pregnancy and has Vitamin D deficiency; Class 1 obesity without serious comorbidity with body mass index (BMI) of 30.0 to 30.9 in adult; Supervision of high risk pregnancy, antepartum; History of preterm delivery, currently pregnant; History of abnormal cervical Pap smear; Cervical cyst; Subchorionic hemorrhage; Meralgia paraesthetica, right; Quadriceps weakness; Migraine without aura and without status migrainosus, not intractable; Pregnancy headache, antepartum; Muscle spasm; and Desires tubal ligation  on their problem list.  Patient reports irregular contractions. .  Contractions: Irritability. Vag. Bleeding: None.  Movement: Present. Denies leaking of fluid.   The following portions of the patient's history were reviewed and updated as appropriate: allergies, current medications, past family history, past medical history, past social history, past surgical history and problem list. Problem list updated.  Objective:   Vitals:   07/10/17 1111  BP: 119/74  Pulse: 92  Weight: 203 lb (92.1 kg)    Fetal Status: Fetal Heart Rate (bpm): 141 Fundal Height: 37 cm Movement: Present     General:  Alert, oriented and cooperative. Patient is in no acute distress.  Skin: Skin is warm and dry. No rash noted.   Cardiovascular: Normal heart rate noted  Respiratory: Normal respiratory effort, no problems with respiration noted  Abdomen: Soft, gravid, appropriate for gestational age.  Pain/Pressure: Present     Pelvic: Cervical exam deferred        Extremities: Normal range of motion.  Edema: Trace  Mental Status:  Normal mood and affect. Normal behavior. Normal judgment and thought content.   Assessment and Plan:  Pregnancy: [id number] at 2470w0d  1. Supervision of high risk pregnancy, antepartum Patient  doing well; no complaints. Feels some contractions; reviewed warning signs of labor.   Term labor symptoms and general obstetric precautions including but not limited to vaginal bleeding, contractions, leaking of fluid and fetal movement were reviewed in detail with the patient. Please refer to After Visit Summary for other counseling recommendations.  Return in about 1 week (around 07/17/2017).   Paula Huff, CNM

## 2017-07-16 ENCOUNTER — Inpatient Hospital Stay (HOSPITAL_COMMUNITY): Payer: 59 | Admitting: Anesthesiology

## 2017-07-16 ENCOUNTER — Ambulatory Visit (INDEPENDENT_AMBULATORY_CARE_PROVIDER_SITE_OTHER): Payer: 59 | Admitting: Obstetrics & Gynecology

## 2017-07-16 ENCOUNTER — Encounter: Payer: Self-pay | Admitting: Obstetrics & Gynecology

## 2017-07-16 ENCOUNTER — Inpatient Hospital Stay (HOSPITAL_COMMUNITY)
Admission: AD | Admit: 2017-07-16 | Discharge: 2017-07-18 | DRG: 798 | Disposition: A | Discharge status: Home / Self Care | Payer: 59 | Source: Ambulatory Visit | Attending: Obstetrics & Gynecology | Admitting: Obstetrics & Gynecology

## 2017-07-16 ENCOUNTER — Encounter (HOSPITAL_COMMUNITY): Payer: Self-pay | Admitting: *Deleted

## 2017-07-16 VITALS — BP 115/72 | HR 120 | Wt 203.0 lb

## 2017-07-16 DIAGNOSIS — O99214 Obesity complicating childbirth: Secondary | ICD-10-CM | POA: Diagnosis present

## 2017-07-16 DIAGNOSIS — Z3483 Encounter for supervision of other normal pregnancy, third trimester: Secondary | ICD-10-CM | POA: Diagnosis present

## 2017-07-16 DIAGNOSIS — O0990 Supervision of high risk pregnancy, unspecified, unspecified trimester: Secondary | ICD-10-CM

## 2017-07-16 DIAGNOSIS — Z302 Encounter for sterilization: Secondary | ICD-10-CM

## 2017-07-16 DIAGNOSIS — Z9851 Tubal ligation status: Secondary | ICD-10-CM

## 2017-07-16 DIAGNOSIS — O99824 Streptococcus B carrier state complicating childbirth: Secondary | ICD-10-CM | POA: Diagnosis not present

## 2017-07-16 DIAGNOSIS — E559 Vitamin D deficiency, unspecified: Secondary | ICD-10-CM | POA: Diagnosis present

## 2017-07-16 DIAGNOSIS — E669 Obesity, unspecified: Secondary | ICD-10-CM | POA: Diagnosis present

## 2017-07-16 DIAGNOSIS — Z3A37 37 weeks gestation of pregnancy: Secondary | ICD-10-CM | POA: Diagnosis not present

## 2017-07-16 DIAGNOSIS — Z6830 Body mass index (BMI) 30.0-30.9, adult: Secondary | ICD-10-CM

## 2017-07-16 DIAGNOSIS — O322XX0 Maternal care for transverse and oblique lie, not applicable or unspecified: Secondary | ICD-10-CM | POA: Diagnosis present

## 2017-07-16 DIAGNOSIS — E66811 Obesity, class 1: Secondary | ICD-10-CM

## 2017-07-16 DIAGNOSIS — O0993 Supervision of high risk pregnancy, unspecified, third trimester: Secondary | ICD-10-CM

## 2017-07-16 HISTORY — DX: Headache, unspecified: R51.9

## 2017-07-16 HISTORY — DX: Headache: R51

## 2017-07-16 LAB — CBC
HCT: 39 % (ref 36.0–46.0)
HEMOGLOBIN: 12.7 g/dL (ref 12.0–15.0)
MCH: 27.3 pg (ref 26.0–34.0)
MCHC: 32.6 g/dL (ref 30.0–36.0)
MCV: 83.7 fL (ref 78.0–100.0)
PLATELETS: 403 10*3/uL — AB (ref 150–400)
RBC: 4.66 MIL/uL (ref 3.87–5.11)
RDW: 14.5 % (ref 11.5–15.5)
WBC: 11.5 10*3/uL — ABNORMAL HIGH (ref 4.0–10.5)

## 2017-07-16 LAB — TYPE AND SCREEN
ABO/RH(D): B POS
ANTIBODY SCREEN: NEGATIVE

## 2017-07-16 LAB — ABO/RH: ABO/RH(D): B POS

## 2017-07-16 MED ORDER — WITCH HAZEL-GLYCERIN EX PADS: 1.0000 "application " | MEDICATED_PAD | CUTANEOUS | Status: DC | PRN

## 2017-07-16 MED ORDER — ONDANSETRON HCL 4 MG/2ML IJ SOLN
4.0000 mg | INTRAMUSCULAR | Status: DC | PRN
  Administered 2017-07-17: 4 mg via INTRAVENOUS

## 2017-07-16 MED ORDER — LACTATED RINGERS IV SOLN
INTRAVENOUS | Status: DC
  Administered 2017-07-16 (×2): via INTRAVENOUS

## 2017-07-16 MED ORDER — PHENYLEPHRINE 40 MCG/ML (10ML) SYRINGE FOR IV PUSH (FOR BLOOD PRESSURE SUPPORT)
80.0000 ug | PREFILLED_SYRINGE | INTRAVENOUS | Status: DC | PRN
  Filled 2017-07-16: qty 5

## 2017-07-16 MED ORDER — BENZOCAINE-MENTHOL 20-0.5 % EX AERO
1.0000 "application " | INHALATION_SPRAY | CUTANEOUS | Status: DC | PRN
  Administered 2017-07-16: 1 via TOPICAL
  Filled 2017-07-16: qty 56

## 2017-07-16 MED ORDER — ACETAMINOPHEN 325 MG PO TABS: 650.0000 mg | ORAL_TABLET | ORAL | Status: DC | PRN

## 2017-07-16 MED ORDER — SIMETHICONE 80 MG PO CHEW: 80.0000 mg | CHEWABLE_TABLET | ORAL | Status: DC | PRN

## 2017-07-16 MED ORDER — SENNOSIDES-DOCUSATE SODIUM 8.6-50 MG PO TABS
2.0000 | ORAL_TABLET | ORAL | Status: DC
  Administered 2017-07-18: 2 via ORAL
  Filled 2017-07-16: qty 2

## 2017-07-16 MED ORDER — LIDOCAINE HCL (PF) 1 % IJ SOLN
INTRAMUSCULAR | Status: DC | PRN
  Administered 2017-07-16 (×2): 4 mL via EPIDURAL

## 2017-07-16 MED ORDER — SOD CITRATE-CITRIC ACID 500-334 MG/5ML PO SOLN: 30.0000 mL | ORAL | Status: DC | PRN

## 2017-07-16 MED ORDER — ONDANSETRON HCL 4 MG/2ML IJ SOLN: 4.0000 mg | Freq: Four times a day (QID) | INTRAMUSCULAR | Status: DC | PRN

## 2017-07-16 MED ORDER — OXYTOCIN 40 UNITS IN LACTATED RINGERS INFUSION - SIMPLE MED
2.5000 [IU]/h | INTRAVENOUS | Status: DC
  Filled 2017-07-16: qty 1000

## 2017-07-16 MED ORDER — ONDANSETRON HCL 4 MG PO TABS: 4.0000 mg | ORAL_TABLET | ORAL | Status: DC | PRN

## 2017-07-16 MED ORDER — PHENYLEPHRINE 40 MCG/ML (10ML) SYRINGE FOR IV PUSH (FOR BLOOD PRESSURE SUPPORT)
PREFILLED_SYRINGE | INTRAVENOUS | Status: AC
  Filled 2017-07-16: qty 10

## 2017-07-16 MED ORDER — DIPHENHYDRAMINE HCL 25 MG PO CAPS: 25.0000 mg | ORAL_CAPSULE | Freq: Four times a day (QID) | ORAL | Status: DC | PRN

## 2017-07-16 MED ORDER — DIBUCAINE 1 % RE OINT: 1.0000 "application " | TOPICAL_OINTMENT | RECTAL | Status: DC | PRN

## 2017-07-16 MED ORDER — COCONUT OIL OIL
1.0000 "application " | TOPICAL_OIL | Status: DC | PRN
  Administered 2017-07-18: 1 via TOPICAL
  Filled 2017-07-16: qty 120

## 2017-07-16 MED ORDER — LACTATED RINGERS IV SOLN
500.0000 mL | Freq: Once | INTRAVENOUS | Status: AC
  Administered 2017-07-16: 1000 mL via INTRAVENOUS

## 2017-07-16 MED ORDER — FENTANYL CITRATE (PF) 100 MCG/2ML IJ SOLN: 50.0000 ug | INTRAMUSCULAR | Status: DC | PRN

## 2017-07-16 MED ORDER — LIDOCAINE HCL (PF) 1 % IJ SOLN
30.0000 mL | INTRAMUSCULAR | Status: DC | PRN
  Filled 2017-07-16: qty 30

## 2017-07-16 MED ORDER — IBUPROFEN 600 MG PO TABS
600.0000 mg | ORAL_TABLET | Freq: Four times a day (QID) | ORAL | Status: DC
  Administered 2017-07-16 – 2017-07-18 (×5): 600 mg via ORAL
  Filled 2017-07-16 (×5): qty 1

## 2017-07-16 MED ORDER — OXYCODONE-ACETAMINOPHEN 5-325 MG PO TABS: 2.0000 | ORAL_TABLET | ORAL | Status: DC | PRN

## 2017-07-16 MED ORDER — FENTANYL 2.5 MCG/ML BUPIVACAINE 1/10 % EPIDURAL INFUSION (WH - ANES)
INTRAMUSCULAR | Status: AC
  Filled 2017-07-16: qty 100

## 2017-07-16 MED ORDER — LACTATED RINGERS IV SOLN: 500.0000 mL | INTRAVENOUS | Status: DC | PRN

## 2017-07-16 MED ORDER — OXYCODONE-ACETAMINOPHEN 5-325 MG PO TABS: 1.0000 | ORAL_TABLET | ORAL | Status: DC | PRN

## 2017-07-16 MED ORDER — DIPHENHYDRAMINE HCL 50 MG/ML IJ SOLN: 12.5000 mg | INTRAMUSCULAR | Status: DC | PRN

## 2017-07-16 MED ORDER — PENICILLIN G POTASSIUM 5000000 UNITS IJ SOLR
5.0000 10*6.[IU] | Freq: Once | INTRAMUSCULAR | Status: AC
  Administered 2017-07-16: 5 10*6.[IU] via INTRAVENOUS
  Filled 2017-07-16: qty 5

## 2017-07-16 MED ORDER — FENTANYL 2.5 MCG/ML BUPIVACAINE 1/10 % EPIDURAL INFUSION (WH - ANES)
14.0000 mL/h | INTRAMUSCULAR | Status: DC | PRN
  Administered 2017-07-16: 14 mL/h via EPIDURAL

## 2017-07-16 MED ORDER — OXYTOCIN BOLUS FROM INFUSION
500.0000 mL | Freq: Once | INTRAVENOUS | Status: AC
  Administered 2017-07-16: 500 mL via INTRAVENOUS

## 2017-07-16 MED ORDER — PENICILLIN G POT IN DEXTROSE 60000 UNIT/ML IV SOLN
3.0000 10*6.[IU] | INTRAVENOUS | Status: DC
  Filled 2017-07-16 (×2): qty 50

## 2017-07-16 MED ORDER — PRENATAL MULTIVITAMIN CH
1.0000 | ORAL_TABLET | Freq: Every day | ORAL | Status: DC
  Administered 2017-07-18: 1 via ORAL
  Filled 2017-07-16: qty 1

## 2017-07-16 MED ORDER — ZOLPIDEM TARTRATE 5 MG PO TABS: 5.0000 mg | ORAL_TABLET | Freq: Every evening | ORAL | Status: DC | PRN

## 2017-07-16 MED ORDER — ACETAMINOPHEN 325 MG PO TABS
650.0000 mg | ORAL_TABLET | ORAL | Status: DC | PRN
  Administered 2017-07-17 – 2017-07-18 (×2): 650 mg via ORAL
  Filled 2017-07-16 (×2): qty 2

## 2017-07-16 MED ORDER — TETANUS-DIPHTH-ACELL PERTUSSIS 5-2.5-18.5 LF-MCG/0.5 IM SUSP: 0.5000 mL | Freq: Once | INTRAMUSCULAR | Status: DC

## 2017-07-16 MED ORDER — EPHEDRINE 5 MG/ML INJ
10.0000 mg | INTRAVENOUS | Status: DC | PRN
  Filled 2017-07-16: qty 2

## 2017-07-16 NOTE — Progress Notes (Signed)
Pt has been contracting regularly since yesterday and they have increased in pain today

## 2017-07-16 NOTE — Progress Notes (Signed)
   PRENATAL VISIT NOTE  Subjective:  Paula Huff is a 26 y.o. [id number] at [redacted]w[redacted]d being seen today for ongoing prenatal care.  She is currently monitored for the following issues for this high-risk pregnancy and has Vitamin D deficiency; Class 1 obesity without serious comorbidity with body mass index (BMI) of 30.0 to 30.9 in adult; Supervision of high risk pregnancy, antepartum; History of preterm delivery, currently pregnant; History of abnormal cervical Pap smear; Cervical cyst; Subchorionic hemorrhage; Meralgia paraesthetica, right; Quadriceps weakness; Migraine without aura and without status migrainosus, not intractable; Pregnancy headache, antepartum; Muscle spasm; and Desires tubal ligation  on their problem list.  Patient reports contractions since yesterday.  Contractions: Regular. Vag. Bleeding: None.  Movement: Present. Denies leaking of fluid.   The following portions of the patient's history were reviewed and updated as appropriate: allergies, current medications, past family history, past medical history, past social history, past surgical history and problem list. Problem list updated.  Objective:   Vitals:   07/16/17 1502  BP: 115/72  Pulse: (!) 120  Weight: 203 lb (92.1 kg)    Fetal Status: Fetal Heart Rate (bpm): 146   Movement: Present     General:  Alert, oriented and cooperative. Patient is in no acute distress.  Skin: Skin is warm and dry. No rash noted.   Cardiovascular: Normal heart rate noted  Respiratory: Normal respiratory effort, no problems with respiration noted  Abdomen: Soft, gravid, appropriate for gestational age.  Pain/Pressure: Present     Pelvic: Cervical exam performed        Extremities: Normal range of motion.     Mental Status:  Normal mood and affect. Normal behavior. Normal judgment and thought content.   Assessment and Plan:  Pregnancy: [id number] at [redacted]w[redacted]d  There are no diagnoses linked to this encounter. Preterm labor symptoms and general  obstetric precautions including but not limited to vaginal bleeding, contractions, leaking of fluid and fetal movement were reviewed in detail with the patient. Please refer to After Visit Summary for other counseling recommendations.  No Follow-up on file.  To L&D  Allie BossierMyra C Nakima Fluegge, MD

## 2017-07-16 NOTE — Anesthesia Preprocedure Evaluation (Signed)
Anesthesia Evaluation  Patient identified by MRN, date of birth, ID band Patient awake    Reviewed: Allergy & Precautions, H&P , NPO status , Patient's Chart, lab work & pertinent test results  History of Anesthesia Complications Negative for: history of anesthetic complications  Airway Mallampati: II  TM Distance: >3 FB Neck ROM: full    Dental no notable dental hx. (+) Teeth Intact   Pulmonary neg pulmonary ROS,    Pulmonary exam normal breath sounds clear to auscultation       Cardiovascular negative cardio ROS Normal cardiovascular exam Rhythm:regular Rate:Normal     Neuro/Psych  Headaches, PSYCHIATRIC DISORDERS Anxiety Depression  Neuromuscular disease    GI/Hepatic Neg liver ROS, GERD  ,  Endo/Other  negative endocrine ROS  Renal/GU negative Renal ROS     Musculoskeletal   Abdominal (+) + obese,   Peds  Hematology negative hematology ROS (+)   Anesthesia Other Findings   Reproductive/Obstetrics (+) Pregnancy                             Anesthesia Physical Anesthesia Plan  ASA: II  Anesthesia Plan: Epidural   Post-op Pain Management:    Induction:   PONV Risk Score and Plan:   Airway Management Planned:   Additional Equipment:   Intra-op Plan:   Post-operative Plan:   Informed Consent: I have reviewed the patients History and Physical, chart, labs and discussed the procedure including the risks, benefits and alternatives for the proposed anesthesia with the patient or authorized representative who has indicated his/her understanding and acceptance.     Plan Discussed with:   Anesthesia Plan Comments:         Anesthesia Quick Evaluation

## 2017-07-16 NOTE — H&P (Signed)
OBSTETRIC ADMISSION HISTORY AND PHYSICAL  Paula Huff is a 26 y.o. female [phone] with IUP at 3761w6d by LMP(10/24/16) presenting for SOL. Contractions since 07/15/17 evening. She has a history of preterm labor and delivery. She states that she has not needed medication or bedrest during this pregnancy. She reports +FMs, No LOF, no VB, no blurry vision, headaches or peripheral edema, and RUQ pain.  She plans on breast and bottle feeding. She request tubal ligation for birth control. [facility] [place]   Dating: By LMP(10/24/16) --->  Estimated Date of Delivery: 07/31/17  Sono:  04/17/17 @[redacted]w[redacted]d , CWD, normal anatomy, cephalic presentation, 820g, 63% EFW   Prenatal History/Complications: -Vitamin d deficiency -h/o Preterm delivery (1st delivery @27weeks , 2nd delivery @36wks  after 17p, vaginal progesterone and bedrest) -Pregnancy headache -history of abnormal cervical pap smear -Class 1 obesity    Past Medical History: Past Medical History:  Diagnosis Date  . Anxiety   . Depression   . GERD (gastroesophageal reflux disease)   . History of preterm labor   . Insomnia   . Medical history non-contributory   . Palpitations   . Prediabetes   . Vaginal Pap smear, abnormal     Past Surgical History: Past Surgical History:  Procedure Laterality Date  . NO PAST SURGERIES      Obstetrical History: OB History    Gravida Para Term Preterm AB Living   3 2 0 2   2   SAB TAB Ectopic Multiple Live Births           2      Social History: Social History   Socioeconomic History  . Marital status: Single    Spouse name: Not on file  . Number of children: Not on file  . Years of education: Not on file  . Highest education level: Not on file  Social Needs  . Financial resource strain: Not on file  . Food insecurity - worry: Not on file  . Food insecurity - inability: Not on file  . Transportation needs - medical: Not on file  . Transportation needs -  non-medical: Not on file  Occupational History  . Occupation: RMA    Employer: [employer]  Tobacco Use  . Smoking status: Never Smoker  . Smokeless tobacco: Never Used  Substance and Sexual Activity  . Alcohol use: No  . Drug use: No  . Sexual activity: Yes    Birth control/protection: None  Other Topics Concern  . Not on file  Social History Narrative  . Not on file    Family History: Family History  Problem Relation Age of Onset  . Hypertension Mother   . Depression Mother   . Anxiety disorder Mother   . Diabetes Father   . Obesity Father   . Hypothyroidism Sister   . Diabetes Brother   . Diabetes Maternal Grandmother     Allergies: No Known Allergies  Medications Prior to Admission  Medication Sig Dispense Refill Last Dose  . butalbital-acetaminophen-caffeine (FIORICET, ESGIC) 50-325-40 MG tablet Take 1-2 tablets by mouth every 6 (six) hours as needed for headache. 20 tablet 0 Taking  . cyclobenzaprine (FLEXERIL) 10 MG tablet Take 1 tablet (10 mg total) by mouth every 8 (eight) hours as needed for muscle spasms. 30 tablet 1 Taking  . Prenatal Vit-Fe Fumarate-FA (PRENATAL VITAMIN PO) Take by mouth.   Taking  . Vitamin D, Cholecalciferol, 1000 units CAPS Take 1 capsule by mouth daily. 60 capsule 6 Taking  Review of Systems   All systems reviewed and negative except as stated in HPI  Last menstrual period 10/24/2016. General appearance: alert, cooperative and appears stated age Lungs: clear to auscultation bilaterally Heart: regular rate and rhythm Abdomen: soft, non-tender; bowel sounds normal Extremities: Homans sign is negative, no sign of DVT Presentation: cephalic Fetal monitoringBaseline: 150 bpm, Variability: Fair (1-6 bpm), Accelerations: Reactive and Decelerations: Absent Uterine activityDate/time of onset: 07/16/17 evenig, Frequency: Every 1-2 minutes and Intensity: strong     Prenatal labs: ABO, Rh: B/POS/-- (04/12 1138) Antibody: NEG (04/12  1138) Rubella: 2.26 (04/12 1138) RPR: NON-REACTIVE (09/12 0810)  HBsAg: NEGATIVE (04/12 1138)  HIV: NON-REACTIVE (09/12 0810)  GBS:   Positive 1 hr Glucola  Fasting 77, 1hr 130, 2hr 112 Genetic screening  Quad: negative Anatomy US normal female  Prenatal Transfer Tool  Maternal Diabetes: No Genetic Screening: Normal Maternal Ultrasounds/Referrals: Normal Fetal Ultrasounds or other Referrals:  None Maternal Substance Abuse:  No Significant Maternal Medications:  Meds include: Other: Fioricet Significant Maternal Lab Results: Lab values include: Group B Strep positive  No results found for this or any previous visit (from the past 24 hour(s)).  Patient Active Problem List   Diagnosis Date Noted  . Desires tubal ligation  05/22/2017  . Migraine without aura and without status migrainosus, not intractable 04/17/2017  . Pregnancy headache, antepartum 04/17/2017  . Muscle spasm 04/17/2017  . Meralgia paraesthetica, right 02/24/2017  . Quadriceps weakness 02/24/2017  . Subchorionic hemorrhage 01/13/2017  . History of abnormal cervical Pap smear 12/12/2016  . Cervical cyst 12/12/2016  . History of preterm delivery, currently pregnant 12/11/2016  . Supervision of high risk pregnancy, antepartum 12/09/2016  . Vitamin D deficiency 11/26/2016  . Class 1 obesity without serious comorbidity with body mass index (BMI) of 30.0 to 30.9 in adult 11/26/2016    Assessment/Plan:  Paula Huff is a 26 y.o. [id number] at 8043w6d here for SOL  #Labor:Admit to labor and delivery. Expectant management. If augmentation is necessary pitocin and AROM #Pain: epidural #FWB: Cat 1 #ID:  GBS pos #MOF: breast and bottle #MOC: desires tubal ligation #Circ:  N/a girl  Suella BroadKeriann S Minott, MD  07/16/2017, 3:58 PM  Midwife attestation: I have seen and examined this patient; I agree with above documentation in the resident's note.   Paula Huff is a 26 y.o. [phone] here for labor  PE: Gen: calm  comfortable, NAD Resp: normal effort, no distress Abd: gravid, EFW 7'  ROS, labs, PMH reviewed  Assessment/Plan: Admit to LD Labor: active FWB: Cat I ID: GBS pos >PCN  Donette LarryMelanie Jaymian Bogart, CNM  07/16/2017, 5:43 PM

## 2017-07-16 NOTE — Anesthesia Procedure Notes (Signed)
Epidural Patient location during procedure: OB Start time: 07/16/2017 4:55 PM  Staffing Anesthesiologist: Leonides GrillsEllender, Vianka Ertel P, MD Performed: anesthesiologist   Preanesthetic Checklist Completed: patient identified, site marked, pre-op evaluation, timeout performed, IV checked, risks and benefits discussed and monitors and equipment checked  Epidural Patient position: sitting Prep: DuraPrep Patient monitoring: heart rate, cardiac monitor, continuous pulse ox and blood pressure Approach: midline Location: L4-L5 Injection technique: LOR air  Needle:  Needle type: Tuohy  Needle gauge: 17 G Needle length: 9 cm Needle insertion depth: 6 cm Catheter type: closed end flexible Catheter size: 19 Gauge Catheter at skin depth: 11 cm Test dose: negative and Other  Assessment Events: blood not aspirated, injection not painful, no injection resistance and negative IV test  Additional Notes Informed consent obtained prior to proceeding including risk of failure, 1% risk of PDPH, risk of minor discomfort and bruising. Discussed alternatives to epidural analgesia and patient desires to proceed.  Timeout performed pre-procedure verifying patient name, procedure, and platelet count.  Patient tolerated procedure well. Reason for block:procedure for pain

## 2017-07-16 NOTE — Progress Notes (Signed)
Pt and FOB Taught LEAD in addition to routine Admission info. Parents verbalized understanding.

## 2017-07-16 NOTE — Progress Notes (Signed)
Labor Progress Note Paula Huff is a 26 y.o. [id number] at 1613w6d presented for spontaneous onset of labor. S:  Patient comfortable with epidural, reporting intermittent pressure.  O:  BP 116/77   Pulse (!) 107   Temp 99.1 F (37.3 C) (Oral)   Resp 18   Ht 5\' 5"  (1.651 m)   Wt 203 lb (92.1 kg)   LMP 10/24/2016   SpO2 100%   BMI 33.78 kg/m   Fetal Tracing:  Baseline: 150 Variability: moderate Accels: 10x10 Decels: none  Toco: 2-3   CVE: Dilation: 7 Effacement (%): 80 Station: -2 Presentation: Vertex Exam by:: Paula Huff, CNM   A&P: 26 y.o. [id number] 1513w6d Spontaneous onset of labor #Labor: Progressing well. Patient requesting AROM. Discussed risks and benefits and patient still desires AROM. AROM performed with large amount of clear fluid. Patient tolerated procedure well #Pain: epidural #FWB: Cat 1 #GBS positive  Paula Huff, CNM 6:18 PM

## 2017-07-17 ENCOUNTER — Encounter: Payer: 59 | Admitting: Certified Nurse Midwife

## 2017-07-17 ENCOUNTER — Other Ambulatory Visit: Payer: Self-pay

## 2017-07-17 ENCOUNTER — Encounter (HOSPITAL_COMMUNITY): Payer: Self-pay | Admitting: Obstetrics and Gynecology

## 2017-07-17 ENCOUNTER — Inpatient Hospital Stay (HOSPITAL_COMMUNITY): Payer: 59 | Admitting: Certified Registered Nurse Anesthetist

## 2017-07-17 ENCOUNTER — Encounter (HOSPITAL_COMMUNITY)
Admission: AD | Disposition: A | Discharge status: Home / Self Care | Payer: Self-pay | Source: Ambulatory Visit | Attending: Obstetrics & Gynecology

## 2017-07-17 DIAGNOSIS — O99824 Streptococcus B carrier state complicating childbirth: Secondary | ICD-10-CM

## 2017-07-17 DIAGNOSIS — Z3A37 37 weeks gestation of pregnancy: Secondary | ICD-10-CM

## 2017-07-17 DIAGNOSIS — Z302 Encounter for sterilization: Secondary | ICD-10-CM

## 2017-07-17 HISTORY — PX: TUBAL LIGATION: SHX77

## 2017-07-17 LAB — CBC
HCT: 33.7 % — ABNORMAL LOW (ref 36.0–46.0)
Hemoglobin: 10.9 g/dL — ABNORMAL LOW (ref 12.0–15.0)
MCH: 27.2 pg (ref 26.0–34.0)
MCHC: 32.3 g/dL (ref 30.0–36.0)
MCV: 84 fL (ref 78.0–100.0)
PLATELETS: 345 10*3/uL (ref 150–400)
RBC: 4.01 MIL/uL (ref 3.87–5.11)
RDW: 14.5 % (ref 11.5–15.5)
WBC: 14.8 10*3/uL — AB (ref 4.0–10.5)

## 2017-07-17 LAB — RPR: RPR Ser Ql: NONREACTIVE

## 2017-07-17 SURGERY — LIGATION, FALLOPIAN TUBE, POSTPARTUM
Anesthesia: Epidural | Laterality: Bilateral

## 2017-07-17 MED ORDER — KETOROLAC TROMETHAMINE 30 MG/ML IJ SOLN: 30.0000 mg | Freq: Four times a day (QID) | INTRAMUSCULAR | Status: DC | PRN

## 2017-07-17 MED ORDER — FENTANYL CITRATE (PF) 100 MCG/2ML IJ SOLN: 25.0000 ug | INTRAMUSCULAR | Status: DC | PRN

## 2017-07-17 MED ORDER — DIPHENHYDRAMINE HCL 50 MG/ML IJ SOLN: 12.5000 mg | INTRAMUSCULAR | Status: DC | PRN

## 2017-07-17 MED ORDER — NALOXONE HCL 0.4 MG/ML IJ SOLN: 0.4000 mg | INTRAMUSCULAR | Status: DC | PRN

## 2017-07-17 MED ORDER — FENTANYL CITRATE (PF) 100 MCG/2ML IJ SOLN
INTRAMUSCULAR | Status: DC | PRN
  Administered 2017-07-17 (×2): 50 ug via INTRAVENOUS

## 2017-07-17 MED ORDER — LACTATED RINGERS IV SOLN
INTRAVENOUS | Status: DC
  Administered 2017-07-17 (×2): via INTRAVENOUS

## 2017-07-17 MED ORDER — ONDANSETRON HCL 4 MG/2ML IJ SOLN: 4.0000 mg | Freq: Three times a day (TID) | INTRAMUSCULAR | Status: DC | PRN

## 2017-07-17 MED ORDER — DIPHENHYDRAMINE HCL 25 MG PO CAPS: 25.0000 mg | ORAL_CAPSULE | ORAL | Status: DC | PRN

## 2017-07-17 MED ORDER — METOCLOPRAMIDE HCL 10 MG PO TABS
10.0000 mg | ORAL_TABLET | Freq: Once | ORAL | Status: AC
  Administered 2017-07-17: 10 mg via ORAL
  Filled 2017-07-17: qty 1

## 2017-07-17 MED ORDER — MEPERIDINE HCL 25 MG/ML IJ SOLN: 6.2500 mg | INTRAMUSCULAR | Status: DC | PRN

## 2017-07-17 MED ORDER — LIDOCAINE-EPINEPHRINE (PF) 2 %-1:200000 IJ SOLN
INTRAMUSCULAR | Status: DC | PRN
  Administered 2017-07-17: 5 mL via INTRADERMAL
  Administered 2017-07-17: 2 mL via INTRADERMAL
  Administered 2017-07-17: 3 mL via INTRADERMAL
  Administered 2017-07-17 (×2): 5 mL via INTRADERMAL

## 2017-07-17 MED ORDER — KETOROLAC TROMETHAMINE 30 MG/ML IJ SOLN
INTRAMUSCULAR | Status: DC | PRN
Start: 2017-07-17 — End: 2017-07-17
  Administered 2017-07-17: 30 mg via INTRAVENOUS

## 2017-07-17 MED ORDER — BUPIVACAINE HCL (PF) 0.5 % IJ SOLN
INTRAMUSCULAR | Status: DC | PRN
  Administered 2017-07-17: 20 mL

## 2017-07-17 MED ORDER — OXYCODONE HCL 5 MG/5ML PO SOLN: 5.0000 mg | Freq: Once | ORAL | Status: DC | PRN

## 2017-07-17 MED ORDER — NALBUPHINE HCL 10 MG/ML IJ SOLN: 5.0000 mg | Freq: Once | INTRAMUSCULAR | Status: DC | PRN

## 2017-07-17 MED ORDER — LACTATED RINGERS IV SOLN
INTRAVENOUS | Status: DC
  Administered 2017-07-17: 15:00:00 via INTRAVENOUS

## 2017-07-17 MED ORDER — LIDOCAINE-EPINEPHRINE (PF) 2 %-1:200000 IJ SOLN
INTRAMUSCULAR | Status: AC
  Filled 2017-07-17: qty 20

## 2017-07-17 MED ORDER — ONDANSETRON HCL 4 MG/2ML IJ SOLN
INTRAMUSCULAR | Status: AC
  Filled 2017-07-17: qty 2

## 2017-07-17 MED ORDER — KETOROLAC TROMETHAMINE 30 MG/ML IJ SOLN
INTRAMUSCULAR | Status: AC
  Filled 2017-07-17: qty 1

## 2017-07-17 MED ORDER — PROMETHAZINE HCL 25 MG/ML IJ SOLN: 6.2500 mg | INTRAMUSCULAR | Status: DC | PRN

## 2017-07-17 MED ORDER — OXYCODONE HCL 5 MG PO TABS: 5.0000 mg | ORAL_TABLET | Freq: Once | ORAL | Status: DC | PRN

## 2017-07-17 MED ORDER — MEPERIDINE HCL 25 MG/ML IJ SOLN
6.2500 mg | INTRAMUSCULAR | Status: DC | PRN
Start: 2017-07-17 — End: 2017-07-17

## 2017-07-17 MED ORDER — SCOPOLAMINE 1 MG/3DAYS TD PT72
1.0000 | MEDICATED_PATCH | Freq: Once | TRANSDERMAL | Status: DC
  Filled 2017-07-17: qty 1

## 2017-07-17 MED ORDER — BUPIVACAINE HCL (PF) 0.5 % IJ SOLN
INTRAMUSCULAR | Status: AC
  Filled 2017-07-17: qty 30

## 2017-07-17 MED ORDER — FAMOTIDINE 20 MG PO TABS
40.0000 mg | ORAL_TABLET | Freq: Once | ORAL | Status: AC
  Administered 2017-07-17: 40 mg via ORAL
  Filled 2017-07-17: qty 2

## 2017-07-17 MED ORDER — FENTANYL CITRATE (PF) 100 MCG/2ML IJ SOLN
INTRAMUSCULAR | Status: AC
  Filled 2017-07-17: qty 2

## 2017-07-17 MED ORDER — NALBUPHINE HCL 10 MG/ML IJ SOLN: 5.0000 mg | INTRAMUSCULAR | Status: DC | PRN

## 2017-07-17 MED ORDER — NALOXONE HCL 0.4 MG/ML IJ SOLN: 1.0000 ug/kg/h | INTRAVENOUS | Status: DC | PRN

## 2017-07-17 MED ORDER — SODIUM CHLORIDE 0.9% FLUSH: 3.0000 mL | INTRAVENOUS | Status: DC | PRN

## 2017-07-17 SURGICAL SUPPLY — 23 items
CHLORAPREP W/TINT 26ML (MISCELLANEOUS) ×3 IMPLANT
CLOTH BEACON ORANGE TIMEOUT ST (SAFETY) ×3 IMPLANT
CONTAINER PREFILL 10% NBF 15ML (MISCELLANEOUS) ×6 IMPLANT
DRSG OPSITE POSTOP 3X4 (GAUZE/BANDAGES/DRESSINGS) ×3 IMPLANT
GLOVE BIOGEL M 6.5 STRL (GLOVE) ×3 IMPLANT
GLOVE BIOGEL PI IND STRL 6.5 (GLOVE) ×1 IMPLANT
GLOVE BIOGEL PI IND STRL 7.0 (GLOVE) ×1 IMPLANT
GLOVE BIOGEL PI INDICATOR 6.5 (GLOVE) ×2
GLOVE BIOGEL PI INDICATOR 7.0 (GLOVE) ×2
GOWN STRL REUS W/TWL LRG LVL3 (GOWN DISPOSABLE) ×6 IMPLANT
NEEDLE HYPO 22GX1.5 SAFETY (NEEDLE) ×3 IMPLANT
NS IRRIG 1000ML POUR BTL (IV SOLUTION) ×3 IMPLANT
PACK ABDOMINAL MINOR (CUSTOM PROCEDURE TRAY) ×3 IMPLANT
SPONGE LAP 4X18 X RAY DECT (DISPOSABLE) ×3 IMPLANT
SUT MON AB 4-0 PS1 27 (SUTURE) ×3 IMPLANT
SUT PLAIN 0 NONE (SUTURE) IMPLANT
SUT PLAIN 2 0 (SUTURE) ×2
SUT PLAIN ABS 2-0 CT1 27XMFL (SUTURE) ×1 IMPLANT
SUT VICRYL 0 UR6 27IN ABS (SUTURE) ×3 IMPLANT
SYR CONTROL 10ML LL (SYRINGE) ×3 IMPLANT
TOWEL OR 17X24 6PK STRL BLUE (TOWEL DISPOSABLE) ×6 IMPLANT
TRAY FOLEY CATH SILVER 14FR (SET/KITS/TRAYS/PACK) ×3 IMPLANT
WATER STERILE IRR 1000ML POUR (IV SOLUTION) ×3 IMPLANT

## 2017-07-17 NOTE — Progress Notes (Signed)
Post Partum Day 1  Subjective:  Paula Huff is a 26 y.o. [id number] 3667w6d s/p SVD.  No acute events overnight.  Pt denies problems with ambulating, voiding or po intake.  She denies nausea or vomiting.  Pain is well controlled. Lochia Minimal.  Plan for birth control is bilateral tubal ligation for which she is scheduled for today.  Method of Feeding: Breast and Bottle  Objective: BP 119/69 (BP Location: Left Arm)   Pulse 97   Temp 98.7 F (37.1 C) (Oral)   Resp 18   Ht 5\' 5"  (1.651 m)   Wt 92.1 kg (203 lb)   LMP 10/24/2016   SpO2 98%   Breastfeeding? Unknown   BMI 33.78 kg/m   Physical Exam:  General: alert, cooperative and no distress Lochia:normal flow Chest: normal work of breathing Heart: regular rate Abdomen: soft, nontender Uterine Fundus: firm DVT Evaluation: No evidence of DVT seen on physical exam.   Recent Labs    07/16/17 1611 07/17/17 0615  HGB 12.7 10.9*  HCT 39.0 33.7*    Assessment/Plan:  ASSESSMENT: Paula Huff is a 26 y.o. [id number] 3567w6d ppd #1 s/p NSVD doing well.   Plan for discharge tomorrow Continue routine PP care For BTL today    LOS: 1 day   Caryl AdaJazma Kadarrius Yanke, DO 07/17/2017, 7:49 AM

## 2017-07-17 NOTE — Anesthesia Postprocedure Evaluation (Signed)
Anesthesia Post Note  Patient: Paula Huff  Procedure(s) Performed: AN AD HOC LABOR EPIDURAL     Patient location during evaluation: Mother Baby Anesthesia Type: Epidural Level of consciousness: awake, awake and alert, oriented and patient cooperative Pain management: pain level controlled Vital Signs Assessment: post-procedure vital signs reviewed and stable Respiratory status: spontaneous breathing, nonlabored ventilation and respiratory function stable Cardiovascular status: stable Postop Assessment: no headache, no backache, patient able to bend at knees and no apparent nausea or vomiting Anesthetic complications: no    Last Vitals:  Vitals:   07/16/17 2045 07/16/17 2145  BP: 116/68 119/69  Pulse: (!) 107 97  Resp: 17 18  Temp: 37.1 C 37.1 C  SpO2: 98% 98%    Last Pain:  Vitals:   07/16/17 2346  TempSrc:   PainSc: 4    Pain Goal: Patients Stated Pain Goal: 6 (07/16/17 1637)               Niya Behler L

## 2017-07-17 NOTE — Progress Notes (Signed)
Faculty Note  In to see patient, she again affirms desire to have bilateral tubal ligation done. She understands that this is a permanent procedure and she will not be able to have children after it is done. Reviewed risks of bilateral tubal ligation including infection, hemorrhage, damage to surrounding tissue and organs, risk of regret. Reviewed that bilateral tubal ligation is not 100% effective and she should take a pregnancy test if she believes for any reason she may be pregnant. Reviewed slightly increased risk of ectopic pregnancy and need to seek care if she becomes pregnant. She understands this is an elective procedure and again affirms her desire. Consent signed, also consents to blood transfusion if necessary.   She is NPO To OR when ready   K. Meryl Rameen Gohlke, M.D. Attending Obstetrician & Gynecologist, [facility], [facility]    

## 2017-07-17 NOTE — Op Note (Signed)
   Paula Huff 07/17/2017  PREOPERATIVE DIAGNOSES: Multiparity, undesired fertility  POSTOPERATIVE DIAGNOSES: Multiparity, undesired fertility  PROCEDURE:  Postpartum Bilateral Tubal Sterilization via modified Pomeroy   SURGEON: Baldemar LenisK. Meryl Davis, MD  ANESTHESIA:  Epidural and local analgesia using 20 ml of 0.025% Marcaine  COMPLICATIONS:  None immediate.  ESTIMATED BLOOD LOSS: 20 ml.  FLUIDS: 800 ml LR.  URINE OUTPUT:  200 ml of clear urine.  INDICATIONS:  26 y.o. [id number] with undesired fertility,status post vaginal delivery, desires permanent sterilization.  Other reversible forms of contraception were discussed with patient; she declines all other modalities. Risks of procedure discussed with patient including but not limited to: risk of regret, permanence of method, bleeding, infection, injury to surrounding organs and need for additional procedures.  Failure risk of 1 -2 % with increased risk of ectopic gestation if pregnancy occurs was also discussed with patient.      FINDINGS:  Normal uterus, tubes, and ovaries.  PROCEDURE DETAILS: The patient was taken to the operating room where her epidural anesthesia was dosed up to surgical level and found to be adequate.  She was then placed in the dorsal supine position and prepped and draped in sterile fashion.  After an adequate timeout was performed, attention was turned to the patient's abdomen where a 4 cm transverse skin incision was made inferior to the umbilicus. The incision was taken down to the layer of fascia using the scalpel, and fascia was incised, and extended bilaterally using Mayo scissors. The peritoneum was entered sharply and the uterus and tubes were visualized.     Attention was then turned to the patient's uterus, and the left fallopian tube was clamped using a babcock and followed out to its fimbriated end. A 3 cm portion of the mid-portion of the fallopian tube was grasped with babcock graspers and elevated. The  elevated loop of tube was tied off with 2-0 plain gut. A second tie was placed under the first tie and a 2 cm loop of tube was excised by bluntly creating a hole in the mesosalpinx under the tied off portion above the suture and excising each end of tube with the metzenbaum scissors via a modified pomeroy fashion.  The remaining pedicles of the tube were inspected and found to be hemostatic. The right fallopian tube was visualized and followed out to the fimbriated end.  A similar process was carried out for the right fallopian tube and hemostasis noted at the pedicles. Good hemostasis was noted overall. The instruments were then removed from the patient's abdomen and the fascial incision was repaired with 0 Vicryl, the subcutaneous tissue was closed with 2-0 plain and the skin was closed with a 4-0 Monocryl subcuticular stitch. Marcaine 0.025% was injected around the incision. The patient tolerated the procedure well.  Instrument, sponge, and needle counts were correct times two.  The patient was then taken to the recovery room awake and in stable condition.   Baldemar LenisK. Meryl Davis, M.D. Attending Obstetrician & Gynecologist, [facility], [facility]

## 2017-07-17 NOTE — Anesthesia Preprocedure Evaluation (Signed)
Anesthesia Evaluation  Patient identified by MRN, date of birth, ID band Patient awake    Reviewed: Allergy & Precautions, NPO status , Patient's Chart, lab work & pertinent test results  Airway Mallampati: II  TM Distance: >3 FB Neck ROM: Full    Dental  (+) Teeth Intact, Dental Advisory Given   Pulmonary    breath sounds clear to auscultation       Cardiovascular negative cardio ROS   Rhythm:Regular Rate:Normal     Neuro/Psych  Headaches, PSYCHIATRIC DISORDERS Anxiety Depression  Neuromuscular disease    GI/Hepatic Neg liver ROS, GERD  ,  Endo/Other  negative endocrine ROS  Renal/GU negative Renal ROS  negative genitourinary   Musculoskeletal negative musculoskeletal ROS (+)   Abdominal   Peds  Hematology negative hematology ROS (+)   Anesthesia Other Findings   Reproductive/Obstetrics negative OB ROS                             Anesthesia Physical Anesthesia Plan  ASA: II  Anesthesia Plan: Epidural   Post-op Pain Management:    Induction:   PONV Risk Score and Plan: 1 and 0 and Ondansetron  Airway Management Planned: Natural Airway and Nasal Cannula  Additional Equipment:   Intra-op Plan:   Post-operative Plan:   Informed Consent: I have reviewed the patients History and Physical, chart, labs and discussed the procedure including the risks, benefits and alternatives for the proposed anesthesia with the patient or authorized representative who has indicated his/her understanding and acceptance.     Plan Discussed with: CRNA  Anesthesia Plan Comments:         Anesthesia Quick Evaluation

## 2017-07-17 NOTE — Anesthesia Postprocedure Evaluation (Signed)
Anesthesia Post Note  Patient: Paula Huff  Procedure(s) Performed: POST PARTUM TUBAL LIGATION (Bilateral )     Patient location during evaluation: PACU Anesthesia Type: Epidural Level of consciousness: oriented and awake and alert Pain management: pain level controlled Vital Signs Assessment: post-procedure vital signs reviewed and stable Respiratory status: spontaneous breathing, respiratory function stable and patient connected to nasal cannula oxygen Cardiovascular status: blood pressure returned to baseline and stable Postop Assessment: no headache, no backache, no apparent nausea or vomiting and epidural receding Anesthetic complications: no    Last Vitals:  Vitals:   07/17/17 1515 07/17/17 1545  BP: 106/80 111/64  Pulse: 71   Resp: 14 18  Temp:  36.8 C  SpO2:  98%    Last Pain:  Vitals:   07/17/17 1545  TempSrc: Oral  PainSc:    Pain Goal: Patients Stated Pain Goal: 6 (07/16/17 1637)               Shelton SilvasKevin D Lillionna Nabi

## 2017-07-17 NOTE — Transfer of Care (Signed)
Immediate Anesthesia Transfer of Care Note  Patient: Paula Huff  Procedure(s) Performed: POST PARTUM TUBAL LIGATION (Bilateral )  Patient Location: PACU  Anesthesia Type:Epidural  Level of Consciousness: awake and alert   Airway & Oxygen Therapy: Patient Spontanous Breathing  Post-op Assessment: Report given to RN and Post -op Vital signs reviewed and stable  Post vital signs: Reviewed  Last Vitals:  Vitals:   07/16/17 2145 07/17/17 1140  BP: 119/69 (!) 94/57  Pulse: 97 (!) 59  Resp: 18 20  Temp: 37.1 C 36.8 C  SpO2: 98%     Last Pain:  Vitals:   07/17/17 1140  TempSrc: Oral  PainSc:       Patients Stated Pain Goal: 6 (07/16/17 1637)  Complications: No apparent anesthesia complications

## 2017-07-18 DIAGNOSIS — Z9851 Tubal ligation status: Secondary | ICD-10-CM

## 2017-07-18 LAB — BIRTH TISSUE RECOVERY COLLECTION (PLACENTA DONATION)

## 2017-07-18 MED ORDER — OXYCODONE-ACETAMINOPHEN 5-325 MG PO TABS: 1.0000 | ORAL_TABLET | Freq: Four times a day (QID) | ORAL | 0 refills | Status: DC | PRN

## 2017-07-18 MED ORDER — IBUPROFEN 600 MG PO TABS: 600.0000 mg | ORAL_TABLET | Freq: Four times a day (QID) | ORAL | 0 refills | Status: DC

## 2017-07-18 NOTE — Anesthesia Postprocedure Evaluation (Signed)
Anesthesia Post Note  Patient: Paula Huff  Procedure(s) Performed: POST PARTUM TUBAL LIGATION (Bilateral )     Patient location during evaluation: Mother Baby Anesthesia Type: Epidural Level of consciousness: awake and alert and oriented Pain management: satisfactory to patient Vital Signs Assessment: post-procedure vital signs reviewed and stable Respiratory status: spontaneous breathing and nonlabored ventilation Cardiovascular status: stable Postop Assessment: no headache, no backache, no signs of nausea or vomiting, adequate PO intake and patient able to bend at knees (patient up walking) Anesthetic complications: no    Last Vitals:  Vitals:   07/18/17 0010 07/18/17 0514  BP: 115/65 111/71  Pulse: 79 72  Resp: 17 17  Temp: 36.7 C 36.8 C  SpO2: 100% 99%    Last Pain:  Vitals:   07/18/17 0515  TempSrc:   PainSc: 2    Pain Goal: Patients Stated Pain Goal: 6 (07/16/17 1637)               Madison HickmanGREGORY,Tito Ausmus

## 2017-07-18 NOTE — Anesthesia Postprocedure Evaluation (Signed)
Anesthesia Post Note  Patient: Paula Huff  Procedure(s) Performed: AN AD HOC LABOR EPIDURAL     Patient location during evaluation: Mother Baby Anesthesia Type: Epidural Level of consciousness: awake and alert and oriented Pain management: satisfactory to patient Vital Signs Assessment: post-procedure vital signs reviewed and stable Respiratory status: spontaneous breathing and nonlabored ventilation Cardiovascular status: stable Postop Assessment: no headache, no backache, no signs of nausea or vomiting, adequate PO intake and patient able to bend at knees (patient up walking) Anesthetic complications: no    Last Vitals:  Vitals:   07/18/17 0010 07/18/17 0514  BP: 115/65 111/71  Pulse: 79 72  Resp: 17 17  Temp: 36.7 C 36.8 C  SpO2: 100% 99%    Last Pain:  Vitals:   07/18/17 0515  TempSrc:   PainSc: 2    Pain Goal: Patients Stated Pain Goal: 6 (07/16/17 1637)               Madison HickmanGREGORY,Nusaiba Guallpa

## 2017-07-18 NOTE — Addendum Note (Signed)
Addendum  created 07/18/17 0856 by Alisse Tuite M, CRNA   Charge Capture section accepted, Sign clinical note    

## 2017-07-18 NOTE — Addendum Note (Signed)
Addendum  created 07/18/17 0856 by Shanon PayorGregory, Que Meneely M, CRNA   Charge Capture section accepted, Sign clinical note

## 2017-07-18 NOTE — Lactation Note (Signed)
This note was copied from a baby's chart. 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  Medela DEBP given for home use.  Instructed to pump breasts anytime baby doesn't latch or she gives a bottle.  Encouraged to call prn.   Maternal Data    Feeding    LATCH Score                   Interventions    Lactation Tools Discussed/Used     Consult Status      Huston FoleyMOULDEN, [name] 07/18/2017, 11:14 AM

## 2017-07-18 NOTE — Discharge Instructions (Signed)
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
## 2017-07-18 NOTE — Discharge Summary (Signed)
OB Discharge Summary     Patient Name: Paula Huff DOB: [birth year] MRN: [id number]  Date of admission: 07/16/2017 Delivering MD: Rolm BookbinderNEILL, CAROLINE M   Date of discharge: 07/18/2017  Admitting diagnosis: 37.6wks CTX 8 to 10mins 5cm dilation today at doc office Intrauterine pregnancy: 6352w6d     Secondary diagnosis:  Principal Problem:   SVD (spontaneous vaginal delivery) Active Problems:   Class 1 obesity without serious comorbidity with body mass index (BMI) of 30.0 to 30.9 in adult   Indication for care in labor or delivery   Status post tubal ligation  Additional problems: none     Discharge diagnosis: Term Pregnancy Delivered                                                                                                Post partum procedures:postpartum tubal ligation  Augmentation: AROM  Complications: None  Hospital course:  Onset of Labor With Vaginal Delivery     26 y.o. yo [id number] at 6352w6d was admitted in Active Labor on 07/16/2017. Patient had an uncomplicated labor course as follows:  Membrane Rupture Time/Date: 6:13 PM ,07/16/2017   Intrapartum Procedures: Episiotomy: None [1]                                         Lacerations:  Periurethral [8]  Patient had a delivery of a Viable infant. 07/16/2017  Information for the patient's newborn:  Catalina LungerHenry, Girl Rejeana [[id number]][[id number]]  Delivery Method: Vaginal, Spontaneous(Filed from Delivery Summary)    Pateint had an uncomplicated postpartum course.  She is ambulating, tolerating a regular diet, passing flatus, and urinating well. Patient is discharged home in stable condition on 07/18/17.   Physical exam  Vitals:   07/17/17 1631 07/17/17 2030 07/18/17 0010 07/18/17 0514  BP: (!) 119/50 (!) 107/55 115/65 111/71  Pulse:  72 79 72  Resp: 18 17 17 17   Temp: 98.2 F (36.8 C) 97.7 F (36.5 C) 98 F (36.7 C) 98.2 F (36.8 C)  TempSrc: Oral Oral Oral Oral  SpO2: 99% 100% 100% 99%  Weight:      Height:       General:  alert, cooperative and no distress Lochia: appropriate Uterine Fundus: firm Incision: Healing well with no significant drainage, No significant erythema, minimal saturation DVT Evaluation: No evidence of DVT seen on physical exam. Labs: Lab Results  Component Value Date   WBC 14.8 (H) 07/17/2017   HGB 10.9 (L) 07/17/2017   HCT 33.7 (L) 07/17/2017   MCV 84.0 07/17/2017   PLT 345 07/17/2017   CMP Latest Ref Rng & Units 10/14/2016  Glucose 65 - 99 mg/dL 76  BUN 6 - 20 mg/dL 11  Creatinine 1.300.57 - 8.651.00 mg/dL 7.840.83  Sodium 696134 - 295144 mmol/L 140  Potassium 3.5 - 5.2 mmol/L 4.7  Chloride 96 - 106 mmol/L 101  CO2 18 - 29 mmol/L 22  Calcium 8.7 - 10.2 mg/dL 9.7  Total Protein 6.0 - 8.5 g/dL 7.3  Total Bilirubin 0.0 -  1.2 mg/dL 0.4  Alkaline Phos 39 - 117 IU/L 77  AST 0 - 40 IU/L 12  ALT 0 - 32 IU/L 14    Discharge instruction: per After Visit Summary and "Baby and Me Booklet".  After visit meds:  Allergies as of 07/18/2017   No Known Allergies     Medication List    TAKE these medications   butalbital-acetaminophen-caffeine 50-325-40 MG tablet Commonly known as:  FIORICET, ESGIC Take 1-2 tablets by mouth every 6 (six) hours as needed for headache.   cyclobenzaprine 10 MG tablet Commonly known as:  FLEXERIL Take 1 tablet (10 mg total) by mouth every 8 (eight) hours as needed for muscle spasms.   ibuprofen 600 MG tablet Commonly known as:  ADVIL,MOTRIN Take 1 tablet (600 mg total) every 6 (six) hours by mouth.   oxyCODONE-acetaminophen 5-325 MG tablet Commonly known as:  ROXICET Take 1 tablet every 6 (six) hours as needed by mouth.   PRENATAL VITAMIN PO Take 1 tablet daily by mouth.   Vitamin D (Cholecalciferol) 1000 units Caps Take 1 capsule by mouth daily.       Diet: routine diet  Activity: Advance as tolerated. Pelvic rest for 6 weeks.   Outpatient follow up:4 weeks Follow up Appt:No future appointments. Follow up Visit:No Follow-up on file.  Postpartum  contraception: Tubal Ligation  Newborn Data: Live born female  Birth Weight: 6 lb 13.9 oz (3115 g) APGAR: 8, 9  Newborn Delivery   Birth date/time:  07/16/2017 18:29:00 Delivery type:  Vaginal, Spontaneous     Baby Feeding: Bottle and Breast Disposition:home with mother   07/18/2017 Lovena NeighboursAbdoulaye Diallo, MD  OB FELLOW DISCHARGE ATTESTATION  I have seen and examined this patient and agree with above documentation in the resident's note.   Frederik PearJulie P Adaysha Dubinsky, MD OB Fellow 9:16 AM

## 2017-07-20 MED FILL — IBUPROFEN 600 MG TABLET: 600 | 15 days supply | Qty: 60 | Fill #0

## 2017-08-11 ENCOUNTER — Encounter: Payer: Self-pay | Admitting: Obstetrics and Gynecology

## 2017-08-11 ENCOUNTER — Ambulatory Visit (INDEPENDENT_AMBULATORY_CARE_PROVIDER_SITE_OTHER): Payer: 59 | Admitting: Obstetrics and Gynecology

## 2017-08-11 NOTE — Progress Notes (Signed)
Subjective:     Paula Huff is a 26 y.o. female who presents for a postpartum visit. She is 4 weeks postpartum following a spontaneous vaginal delivery. I have fully reviewed the prenatal and intrapartum course. The delivery was at 37 gestational weeks. Outcome: spontaneous vaginal delivery. Anesthesia: epidural. Postpartum course has been uncomplicated. Baby's course has been uncomplicated. Baby is feeding by breast. Bleeding no bleeding. Bowel function is normal. Bladder function is normal. Patient is not sexually active. Contraception method is tubal ligation. Postpartum depression screening: negative. However, patient reports suffering from a lot of anxiety. Her sister loss a child last december due to SIDS and as a result she is awake most of the night watching her daughter. She reports sleeping 3 hours per night. She tried sharing watch shift with her partner but still is not able to sleep. She is unable to sleep during the day. Patient declined medication at this time but agreed to meet with Asher MuirJamie     Review of Systems Pertinent items are noted in HPI.   Objective:    BP 124/77   Pulse 86   Resp 16   Ht 5\' 5"  (1.651 m)   Wt 188 lb (85.3 kg)   Breastfeeding? Yes   BMI 31.28 kg/m   General:  alert, cooperative and no distress   Breasts:  inspection negative, no nipple discharge or bleeding, no masses or nodularity palpable  Lungs: clear to auscultation bilaterally  Heart:  regular rate and rhythm  Abdomen: soft, non-tender; bowel sounds normal; no masses,  no organomegaly  Incision: healed completely   Vulva:  normal  Vagina: normal vagina, no discharge, exudate, lesion, or erythema  Cervix:  multiparous appearance  Corpus: normal size, contour, position, consistency, mobility, non-tender  Adnexa:  normal adnexa and no mass, fullness, tenderness  Rectal Exam: Not performed.        Assessment:     Normal postpartum exam. Pap smear not done at today's visit.   Plan:    1.  Contraception: tubal ligation 2. Patient is medically cleared to return to work and perform all activities of daily living. Patient referred to see Asher MuirJamie behavioral health specialist to provide coping strategies 3. Follow up in: 5 months for annual exam or as needed.

## 2017-08-18 ENCOUNTER — Ambulatory Visit (INDEPENDENT_AMBULATORY_CARE_PROVIDER_SITE_OTHER): Payer: 59 | Admitting: Clinical

## 2017-08-18 DIAGNOSIS — F4323 Adjustment disorder with mixed anxiety and depressed mood: Secondary | ICD-10-CM

## 2017-08-18 NOTE — BH Specialist Note (Signed)
Integrated Behavioral Health Initial Visit  MRN: [id number] Name: Paula Huff  Number of Integrated Behavioral Health Clinician visits:: 1/6 Session Start time: 10:49 AM   Session End time: 11:49 Total time: 1 hour  Type of Service: Integrated Behavioral Health- Individual/Family Interpretor:No. Interpretor Name and Language: n/a   Warm Hand Off Completed.       SUBJECTIVE: Paula Mackievonia Somoza is a 26 y.o. female accompanied by n/a Patient was referred by Dr Jolayne Pantheronstant for depression. Patient reports the following symptoms/concerns: Pt states her primary concern today is constant worry over the baby, that is preventing her from sleeping; also feels depressed over losing sister's baby to SIDS(recent) and losing oldest child's father(9 years ago), both in December, so this month is difficult emotionally. Duration of problem: Postpartum; Severity of problem: severe  OBJECTIVE: Mood: Appropriate and Affect: Appropriate Risk of harm to self or others: No plan to harm self or others  LIFE CONTEXT: Family and Social: Lives with 28yo,28yo,and newborn  School/Work: Maternity leave Self-Care: Recognizing greater need for self-care Life Changes: Recent childbirth  GOALS ADDRESSED: Patient will: 1. Reduce symptoms of: anxiety, depression and insomnia 2. Increase knowledge and/or ability of: self-management skills  3. Demonstrate ability to: Increase healthy adjustment to current life circumstances and Increase adequate support systems for patient/family  INTERVENTIONS: Interventions utilized: Mindfulness or Management consultantelaxation Training, Mining engineerBehavioral Activation, Sleep Hygiene and Psychoeducation and/or Health Education  Standardized Assessments completed: GAD-7 and PHQ 9  ASSESSMENT: Patient currently experiencing Adjustment disorder with mixed anxious and depressed mood.   Patient may benefit from psychoeducation and brief therapeutic interventions regarding coping with symptoms of  Anxiety and  depression.  PLAN: 1. Follow up with behavioral health clinician on : As needed, if symptoms do not improve 2. Behavioral recommendations:  -CALM relaxation breathing exercise every morning and evening -Use sleep app at night for improved family sleep -Read educational materials regarding coping with symptoms of anxiety and depression -Consider postpartum support group [email or web address] 3. Referral(s): Integrated [facility] (In Clinic) and postpartum support group 4. "From scale of 1-10, how likely are you to follow plan?": 8  Vinton Layson C Myson Levi, LCSW

## 2017-09-02 ENCOUNTER — Ambulatory Visit (INDEPENDENT_AMBULATORY_CARE_PROVIDER_SITE_OTHER): Payer: Self-pay | Admitting: Clinical

## 2017-09-02 DIAGNOSIS — F4323 Adjustment disorder with mixed anxiety and depressed mood: Secondary | ICD-10-CM

## 2017-09-02 DIAGNOSIS — F4321 Adjustment disorder with depressed mood: Secondary | ICD-10-CM

## 2017-09-02 NOTE — BH Specialist Note (Signed)
Integrated Behavioral Health Follow Up Visit  MRN: [id number] Name: Paula Huff  Number of Integrated Behavioral Health Clinician visits: 2/6 Session Start time: 1:25 Session End time: 2:10 Total time: 45 minutes  Type of Service: Integrated Behavioral Health- Individual/Family Interpretor:No. Interpretor Name and Language: n/a  SUBJECTIVE: Paula Mackievonia Everetts is a 27 y.o. female accompanied by n/a Patient was referred by self for grief. Patient reports the following symptoms/concerns: Pt states she was starting to feel better after her last visit, but symptoms of depression have increased after her brother-in-law passed away on 08-23-17; pt states she is also feeling dismissed by pediatricians, as she worries there is something wrong with her daughter's breathing. Her daughter turns red, stops breathing, legs get stiff, and arms shake.  Duration of problem: Two week increase; Severity of problem: severe  OBJECTIVE: Mood: Anxious and Depressed and Affect: Tearful Risk of harm to self or others: No plan to harm self or others  LIFE CONTEXT: Family and Social: Lives with 29yo, 29yo, and almost 62mo School/Work: Returning to work in one week Self-Care: At this time, spending as much time as possible with baby Life Changes: Recent childbirth, loss of brother-in-law, daughter's breathing difficulty  GOALS ADDRESSED: Patient will: 1.  Reduce symptoms of: anxiety, depression and stress  2.  Demonstrate ability to: Increase healthy adjustment to current life circumstances and Begin healthy grieving over loss  INTERVENTIONS: Interventions utilized:  Supportive Counseling and Link to [facility] Standardized Assessments completed: none needed ASSESSMENT: Patient currently experiencing Grief and Adjustment disorder with mixed anxious and depressed mood.   Patient may benefit from continued brief therapeutic interventions regarding coping with symptoms of grief, anxiety and  depression.  PLAN: 1. Follow up with behavioral health clinician on : As needed 2. Behavioral recommendations:  -Consider taking video of daughter's breathing at night, to bring to next pediatrician appointment -Consider offering to attend hospice grief counseling with sister -Continue health advocacy for self and children's wellbeing 3. Referral(s): Integrated Art gallery managerBehavioral Health Services (In Clinic) and [employer] [facility]:  Grief counseling 4. "From scale of 1-10, how likely are you to follow plan?": 9  Rae LipsJamie C Raeshaun Simson, LCSW  Depression screen [facility] 2/9 08/18/2017 10/14/2016  Decreased Interest 3 2  Down, Depressed, Hopeless 2 2  PHQ - 2 Score 5 4  Altered sleeping 3 3  Tired, decreased energy 2 2  Change in appetite 1 3  Feeling bad or failure about yourself  1 2  Trouble concentrating 3 3  Moving slowly or fidgety/restless 1 0  Suicidal thoughts 0 1  PHQ-9 Score 16 18   GAD 7 : Generalized Anxiety Score 08/18/2017  Nervous, Anxious, on Edge 3  Control/stop worrying 3  Worry too much - different things 3  Trouble relaxing 3  Restless 2  Easily annoyed or irritable 3  Afraid - awful might happen 3  Total GAD 7 Score 20

## 2017-09-24 ENCOUNTER — Encounter: Payer: Self-pay | Admitting: Family Medicine

## 2017-09-24 ENCOUNTER — Ambulatory Visit (INDEPENDENT_AMBULATORY_CARE_PROVIDER_SITE_OTHER): Payer: 59 | Admitting: Family Medicine

## 2017-09-24 VITALS — BP 108/70 | HR 82 | Temp 98.4°F | Ht 63.0 in | Wt 190.5 lb

## 2017-09-24 DIAGNOSIS — F329 Major depressive disorder, single episode, unspecified: Secondary | ICD-10-CM | POA: Insufficient documentation

## 2017-09-24 DIAGNOSIS — F419 Anxiety disorder, unspecified: Secondary | ICD-10-CM

## 2017-09-24 DIAGNOSIS — F32A Depression, unspecified: Secondary | ICD-10-CM | POA: Insufficient documentation

## 2017-09-24 NOTE — Progress Notes (Signed)
Pre visit review using our clinic review tool, if applicable. No additional management support is needed unless otherwise documented below in the visit note. 
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
## 2017-09-24 NOTE — Patient Instructions (Signed)
Please consider counseling. Contact [phone] to schedule an appointment or inquire about cost/insurance coverage.  Coping skills Choose 5 that work for you:  Take a deep breath  Count to 20  Read a book  Do a puzzle  Meditate  Bake  Sing  Knit  Garden  Pray  Go outside  Call a friend  Listen to music  Take a walk  Color  Send a note  Take a bath  Watch a movie  Be alone in a quiet place  Pet an animal  Visit a friend  Journal  Exercise  Stretch   [facility] [address] [place], [place] [address] [phone]  [facility] [address] [place], [place] [address] [phone]  [facility] [address] [place], [place] [address] [phone]  Call one of these offices sooner than later as it can take 2-3 months to get a new patient appointment.   Aim to do some physical exertion for 150 minutes per week. This is typically divided into 5 days per week, 30 minutes per day. The activity should be enough to get your heart rate up. Anything is better than nothing if you have time constraints. Consider yoga also. YouTube has good Child psychotherapistinstructional videos.   Let us know if you need anything.

## 2017-11-12 ENCOUNTER — Ambulatory Visit (INDEPENDENT_AMBULATORY_CARE_PROVIDER_SITE_OTHER): Payer: 59 | Admitting: Psychology

## 2017-11-12 DIAGNOSIS — F331 Major depressive disorder, recurrent, moderate: Secondary | ICD-10-CM

## 2017-11-24 ENCOUNTER — Ambulatory Visit (INDEPENDENT_AMBULATORY_CARE_PROVIDER_SITE_OTHER): Payer: 59 | Admitting: Psychology

## 2017-11-24 DIAGNOSIS — F331 Major depressive disorder, recurrent, moderate: Secondary | ICD-10-CM

## 2017-12-23 ENCOUNTER — Ambulatory Visit (INDEPENDENT_AMBULATORY_CARE_PROVIDER_SITE_OTHER): Payer: 59 | Admitting: Psychology

## 2017-12-23 DIAGNOSIS — F331 Major depressive disorder, recurrent, moderate: Secondary | ICD-10-CM

## 2018-01-04 ENCOUNTER — Ambulatory Visit (INDEPENDENT_AMBULATORY_CARE_PROVIDER_SITE_OTHER): Payer: 59 | Admitting: Physician Assistant

## 2018-01-04 VITALS — BP 118/75 | HR 71 | Temp 98.0°F

## 2018-01-04 DIAGNOSIS — Z111 Encounter for screening for respiratory tuberculosis: Secondary | ICD-10-CM | POA: Diagnosis not present

## 2018-01-04 NOTE — Progress Notes (Signed)
Pt was seen in clinic today for PPD placement. Pt states this is required for school. 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 Pt has never tested positive for TB before.  Pt tolerated PPD placement in left forearm well, no immediate complications. Pt advised to return in 2 days for PPD read.

## 2018-01-06 ENCOUNTER — Ambulatory Visit (INDEPENDENT_AMBULATORY_CARE_PROVIDER_SITE_OTHER): Payer: 59 | Admitting: Physician Assistant

## 2018-01-06 ENCOUNTER — Ambulatory Visit: Payer: 59 | Admitting: Psychology

## 2018-01-06 VITALS — BP 114/65 | HR 90 | Temp 98.8°F | Resp 16

## 2018-01-06 DIAGNOSIS — Z111 Encounter for screening for respiratory tuberculosis: Secondary | ICD-10-CM | POA: Diagnosis not present

## 2018-01-06 LAB — TB SKIN TEST
INDURATION: 0 mm
TB Skin Test: NEGATIVE

## 2018-01-06 NOTE — Progress Notes (Signed)
   Subjective:    Patient ID: Paula Huff, female    DOB: 1991/07/09, 27 y.o.   MRN: [id number]  HPI Patient here for reading of PPD placed 01-04-18.   Review of Systems     Objective:   Physical Exam    Vitals:   01/06/18 1416  BP: 114/65  Pulse: 90  Resp: 16  Temp: 98.8 F (37.1 C)  SpO2: 100%       Assessment & Plan:  PPD site negative for redness and induration.

## 2018-01-07 ENCOUNTER — Encounter: Payer: Self-pay | Admitting: Physician Assistant

## 2018-01-13 ENCOUNTER — Encounter: Payer: Self-pay | Admitting: Family Medicine

## 2018-01-13 ENCOUNTER — Ambulatory Visit (INDEPENDENT_AMBULATORY_CARE_PROVIDER_SITE_OTHER): Payer: 59 | Admitting: Family Medicine

## 2018-01-13 VITALS — BP 118/82 | HR 72 | Temp 98.9°F | Ht 65.0 in | Wt 206.4 lb

## 2018-01-13 DIAGNOSIS — R41840 Attention and concentration deficit: Secondary | ICD-10-CM

## 2018-01-13 MED ORDER — AMPHETAMINE-DEXTROAMPHETAMINE 10 MG PO TABS: 10.0000 mg | ORAL_TABLET | Freq: Every day | ORAL | 0 refills | Status: DC

## 2018-01-13 MED FILL — DEXTROAMP-AMP 10 MG TAB: 10 | 30 days supply | Qty: 30 | Fill #0

## 2018-01-13 NOTE — Patient Instructions (Signed)
If you do not hear anything about your referral in the next 1-2 weeks, call our office and ask for an update.  Side effects can include decreased appetite, weight loss, poor sleep, increased heart rate, or increased blood pressure.  I will see you at your follow up.  Let us know if you need anything.

## 2018-01-13 NOTE — Progress Notes (Signed)
Chief Complaint  Patient presents with  . problems focusing at school.  has been going on for a month    Subjective: Patient is a 27 y.o. female here for difficulty concentrating.  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  Since that time, she has noticed increased difficulty concentrating and ability to pay attention.  She will start something and that her mind will wander off.  No physical agitation or hypermobility.  She does note that this was an issue back in her childhood years.  She believes her son may have ADHD but no siblings or parents with this diagnosis.  She does have a history of anxiety with depression, however feels this is much better controlled since seeing a counselor.  She is never had a formal evaluation for ADHD, nor has she been on any medication for this issue.  ROS: Psych: As noted in HPI   Past Medical History:  Diagnosis Date  . Anxiety   . Depression   . GERD (gastroesophageal reflux disease)   . Headache    migraines  . History of preterm labor   . Insomnia   . Palpitations   . Prediabetes   . Vaginal Pap smear, abnormal    Objective: BP 118/82 (BP Location: Right Arm, Patient Position: Sitting, Cuff Size: Large)   Pulse 72   Temp 98.9 F (37.2 C) (Oral)   Ht  (1.651 m)   Wt 206 lb 6 oz (93.6 kg)   SpO2 99%   BMI 34.34 kg/m  General: Awake, appears stated age HEENT: MMM, EOMi Heart: RRR Lungs: CTAB, no rales, wheezes or rhonchi. No accessory muscle use Neuro: DTR's equal and symmetric Psych: Age appropriate judgment and insight, normal affect and mood  Assessment and Plan: Inattention - Plan: Ambulatory referral to Psychology, amphetamine-dextroamphetamine (ADDERALL) 10 MG tablet  Discussed getting a formal evaluation and subsequent starting medicine versus starting now empirically.  She wishes for the latter.  We will start a low-dose of Adderall to see how she does.  Counseled on risks of this medicine. She has a  physical scheduled next month and we will recheck to see how she is doing at that time.  We will get a urine at that time.  Controlled substance contract was signed today. The patient voiced understanding and agreement to the plan.  Jilda Roche [place], DO 01/13/18  4:46 PM

## 2018-01-13 NOTE — Progress Notes (Signed)
Pre visit review using our clinic review tool, if applicable. No additional management support is needed unless otherwise documented below in the visit note. 

## 2018-01-26 ENCOUNTER — Encounter: Payer: Self-pay | Admitting: Family Medicine

## 2018-01-26 ENCOUNTER — Other Ambulatory Visit: Payer: Self-pay | Admitting: Family Medicine

## 2018-01-26 MED ORDER — ATOMOXETINE HCL 40 MG PO CAPS: 40.0000 mg | ORAL_CAPSULE | Freq: Every day | ORAL | 2 refills | Status: DC

## 2018-01-26 MED FILL — ATOMOXETINE HCL 40 MG CAPS: 40 | 30 days supply | Qty: 30 | Fill #0

## 2018-01-28 ENCOUNTER — Encounter: Payer: Self-pay | Admitting: Obstetrics & Gynecology

## 2018-01-28 ENCOUNTER — Ambulatory Visit (INDEPENDENT_AMBULATORY_CARE_PROVIDER_SITE_OTHER): Payer: 59 | Admitting: Obstetrics & Gynecology

## 2018-01-28 VITALS — BP 117/69 | HR 85 | Resp 16 | Ht 63.0 in | Wt 205.0 lb

## 2018-01-28 DIAGNOSIS — Z113 Encounter for screening for infections with a predominantly sexual mode of transmission: Secondary | ICD-10-CM

## 2018-01-28 DIAGNOSIS — N898 Other specified noninflammatory disorders of vagina: Secondary | ICD-10-CM

## 2018-01-28 NOTE — Progress Notes (Signed)
   Subjective:    Patient ID: Paula Huff, female    DOB: 07-14-1991, 27 y.o.   MRN: [id number]  HPI  27 yo married P3 here with a 2 day history of a "yellowish" non- odororous, non-itchy vaginal discharge. She has not used any OTC meds or other treatments.  Review of Systems She had a BTL.    Objective:   Physical Exam Breathing, conversing, and ambulating normally Well nourished, well hydrated Black female, no apparent distress Her vaginal exam is normal. There is a non-specific whitish vaginal discharge.    Assessment & Plan:  Vaginal discharge- wet prep sent

## 2018-01-29 LAB — CERVICOVAGINAL ANCILLARY ONLY
BACTERIAL VAGINITIS: POSITIVE — AB
Candida vaginitis: NEGATIVE
Chlamydia: NEGATIVE
Neisseria Gonorrhea: NEGATIVE
Trichomonas: NEGATIVE

## 2018-02-01 ENCOUNTER — Other Ambulatory Visit: Payer: Self-pay | Admitting: Obstetrics & Gynecology

## 2018-02-01 MED ORDER — METRONIDAZOLE 500 MG PO TABS: 500.0000 mg | ORAL_TABLET | Freq: Two times a day (BID) | ORAL | 0 refills | Status: DC

## 2018-02-01 MED FILL — metroNIDAZOLE 500 MG TABS: 500 | 7 days supply | Qty: 14 | Fill #0

## 2018-02-01 NOTE — Progress Notes (Signed)
Flagyl called in for bv

## 2018-02-11 ENCOUNTER — Encounter: Payer: Self-pay | Admitting: Family Medicine

## 2018-02-11 ENCOUNTER — Ambulatory Visit (INDEPENDENT_AMBULATORY_CARE_PROVIDER_SITE_OTHER): Payer: 59 | Admitting: Family Medicine

## 2018-02-11 VITALS — BP 108/62 | HR 65 | Temp 98.3°F | Ht 65.5 in | Wt 203.2 lb

## 2018-02-11 DIAGNOSIS — Z0000 Encounter for general adult medical examination without abnormal findings: Secondary | ICD-10-CM | POA: Diagnosis not present

## 2018-02-11 LAB — CBC
HCT: 37.3 % (ref 36.0–46.0)
HEMOGLOBIN: 12 g/dL (ref 12.0–15.0)
MCHC: 32.3 g/dL (ref 30.0–36.0)
MCV: 82.3 fl (ref 78.0–100.0)
Platelets: 386 10*3/uL (ref 150.0–400.0)
RBC: 4.54 Mil/uL (ref 3.87–5.11)
RDW: 14.7 % (ref 11.5–15.5)
WBC: 6 10*3/uL (ref 4.0–10.5)

## 2018-02-11 LAB — LIPID PANEL
CHOLESTEROL: 106 mg/dL (ref 0–200)
HDL: 48.7 mg/dL (ref >39.00)
LDL CALC: 47 mg/dL (ref 0–99)
NonHDL: 57.08
TRIGLYCERIDES: 50 mg/dL (ref 0.0–149.0)
Total CHOL/HDL Ratio: 2
VLDL: 10 mg/dL (ref 0.0–40.0)

## 2018-02-11 LAB — COMPREHENSIVE METABOLIC PANEL
ALK PHOS: 93 U/L (ref 39–117)
ALT: 42 U/L — AB (ref 0–35)
AST: 14 U/L (ref 0–37)
Albumin: 4.4 g/dL (ref 3.5–5.2)
BILIRUBIN TOTAL: 0.5 mg/dL (ref 0.2–1.2)
BUN: 9 mg/dL (ref 6–23)
CO2: 27 mEq/L (ref 19–32)
Calcium: 9.6 mg/dL (ref 8.4–10.5)
Chloride: 103 mEq/L (ref 96–112)
Creatinine, Ser: 0.74 mg/dL (ref 0.40–1.20)
GFR: 119.28 mL/min (ref >60.00)
Glucose, Bld: 77 mg/dL (ref 70–99)
Potassium: 4 mEq/L (ref 3.5–5.1)
SODIUM: 138 meq/L (ref 135–145)
TOTAL PROTEIN: 7 g/dL (ref 6.0–8.3)

## 2018-02-11 NOTE — Progress Notes (Signed)
Pre visit review using our clinic review tool, if applicable. No additional management support is needed unless otherwise documented below in the visit note. 

## 2018-02-11 NOTE — Patient Instructions (Signed)
Aim to do some physical exertion for 150 minutes per week. This is typically divided into 5 days per week, 30 minutes per day. The activity should be enough to get your heart rate up. Anything is better than nothing if you have time constraints.  Keep the diet clean.  1-2 days to get results of your labs back.  Let us know if you need anything.

## 2018-02-11 NOTE — Progress Notes (Signed)
Chief Complaint  Patient presents with  . Annual Exam     [name] is here for a complete physical.   Her last physical was >1 year ago.  Current diet: in general, a "healthy" diet. Current exercise: Physically active with kids. Weight is stable and she denies daytime fatigue. Patient's last menstrual period was 01/14/2018.Marland Kitchen.  Seatbelt? Yes  Health Maintenance Pap/HPV- Yes Tetanus- Yes HIV screening- Yes  Past Medical History:  Diagnosis Date  . Anxiety   . Depression   . GERD (gastroesophageal reflux disease)   . Headache    migraines  . History of preterm labor   . Insomnia   . Palpitations   . Prediabetes   . Vaginal Pap smear, abnormal      Past Surgical History:  Procedure Laterality Date  . TUBAL LIGATION Bilateral 07/17/2017   Procedure: POST PARTUM TUBAL LIGATION;  Surgeon: Conan Bowensavis, Kelly M, MD;  [facility];  Service: Gynecology;  Laterality: Bilateral;    Medications  Current Outpatient Medications on File Prior to Visit  Medication Sig Dispense Refill  . cyclobenzaprine (FLEXERIL) 10 MG tablet Take 1 tablet (10 mg total) by mouth every 8 (eight) hours as needed for muscle spasms. 30 tablet 1  . Vitamin D, Cholecalciferol, 1000 units CAPS Take 1 capsule by mouth daily. 60 capsule 6   Allergies No Known Allergies  Review of Systems: Constitutional:  no unexpected weight changes Eye:  no recent significant change in vision Ear/Nose/Mouth/Throat:  Ears:  no tinnitus or vertigo and no recent change in hearing Nose/Mouth/Throat:  no complaints of nasal congestion, no sore throat Cardiovascular: no chest pain Respiratory:  no cough and no shortness of breath Gastrointestinal:  no abdominal pain, no change in bowel habits GU:  Female: negative for dysuria or pelvic pain Musculoskeletal/Extremities:  no pain of the joints Integumentary (Skin/Breast):  no abnormal skin lesions reported Neurologic:  no headaches Endocrine:   denies fatigue Hematologic/Lymphatic:  No areas of easy bleeding  Exam BP 108/62 (BP Location: Left Arm, Patient Position: Sitting, Cuff Size: Large)   Pulse 65   Temp 98.3 F (36.8 C) (Oral)   Ht 5' 5.5" (1.664 m)   Wt 203 lb 4 oz (92.2 kg)   LMP 01/14/2018   SpO2 99%   BMI 33.31 kg/m  General:  well developed, well nourished, in no apparent distress Skin:  no significant moles, warts, or growths Head:  no masses, lesions, or tenderness Eyes:  pupils equal and round, sclera anicteric without injection Ears:  canals without lesions, TMs shiny without retraction, no obvious effusion, no erythema Nose:  nares patent, septum midline, mucosa normal, and no drainage or sinus tenderness Throat/Pharynx:  lips and gingiva without lesion; tongue and uvula midline; non-inflamed pharynx; no exudates or postnasal drainage Neck: neck supple without adenopathy, thyromegaly, or masses Lungs:  clear to auscultation, breath sounds equal bilaterally, no respiratory distress Cardio:  regular rate and rhythm, no bruits, no LE edema Abdomen:  abdomen soft, nontender; bowel sounds normal; no masses or organomegaly Genital: Defer to GYN Musculoskeletal:  symmetrical muscle groups noted without atrophy or deformity Extremities:  no clubbing, cyanosis, or edema, no deformities, no skin discoloration Neuro:  gait normal; deep tendon reflexes normal and symmetric Psych: well oriented with normal range of affect and appropriate judgment/insight  Assessment and Plan  Well adult exam - Plan: CBC, Comprehensive metabolic panel, Lipid panel   Well 27 y.o. female. Counseled on diet and exercise. Form for school  filled out.  Other orders as above. Follow up 1 yr or prn. The patient voiced understanding and agreement to the plan.  Jilda Roche [place], DO 02/11/18 1:38 PM

## 2018-02-12 ENCOUNTER — Other Ambulatory Visit: Payer: Self-pay | Admitting: Family Medicine

## 2018-02-12 DIAGNOSIS — R7401 Elevation of levels of liver transaminase levels: Secondary | ICD-10-CM

## 2018-02-12 DIAGNOSIS — R740 Nonspecific elevation of levels of transaminase and lactic acid dehydrogenase [LDH]: Principal | ICD-10-CM

## 2018-02-24 ENCOUNTER — Ambulatory Visit: Payer: 59 | Admitting: Psychology

## 2018-02-26 ENCOUNTER — Ambulatory Visit (INDEPENDENT_AMBULATORY_CARE_PROVIDER_SITE_OTHER): Payer: 59 | Admitting: Psychology

## 2018-02-26 DIAGNOSIS — F331 Major depressive disorder, recurrent, moderate: Secondary | ICD-10-CM | POA: Diagnosis not present

## 2018-03-03 ENCOUNTER — Encounter: Payer: Self-pay | Admitting: Physician Assistant

## 2018-03-03 ENCOUNTER — Ambulatory Visit (INDEPENDENT_AMBULATORY_CARE_PROVIDER_SITE_OTHER): Payer: 59 | Admitting: Physician Assistant

## 2018-03-03 DIAGNOSIS — Z111 Encounter for screening for respiratory tuberculosis: Secondary | ICD-10-CM | POA: Diagnosis not present

## 2018-03-03 NOTE — Progress Notes (Signed)
Pt here for TB test There were no vitals filed for this visit.

## 2018-03-05 ENCOUNTER — Encounter: Payer: Self-pay | Admitting: Physician Assistant

## 2018-03-05 ENCOUNTER — Ambulatory Visit (INDEPENDENT_AMBULATORY_CARE_PROVIDER_SITE_OTHER): Payer: 59 | Admitting: Physician Assistant

## 2018-03-05 DIAGNOSIS — Z111 Encounter for screening for respiratory tuberculosis: Secondary | ICD-10-CM

## 2018-03-05 LAB — TB SKIN TEST
INDURATION: 0 mm
TB SKIN TEST: NEGATIVE

## 2018-03-05 NOTE — Progress Notes (Signed)
   Subjective:    Patient ID: Paula Huff, female    DOB: [birth year], 29 y.o.   MRN: [id number]  HPI Patient in office to have TB read. KG LPN   Review of Systems     Objective:   Physical Exam There were no vitals filed for this visit.        Assessment & Plan:  0 mm induration. Negative

## 2018-03-08 ENCOUNTER — Encounter: Payer: Self-pay | Admitting: *Deleted

## 2018-03-12 ENCOUNTER — Other Ambulatory Visit: Payer: 59

## 2018-03-19 ENCOUNTER — Ambulatory Visit: Payer: 59 | Admitting: Psychology

## 2018-03-19 ENCOUNTER — Ambulatory Visit (INDEPENDENT_AMBULATORY_CARE_PROVIDER_SITE_OTHER): Payer: 59 | Admitting: Psychology

## 2018-03-19 DIAGNOSIS — F411 Generalized anxiety disorder: Secondary | ICD-10-CM

## 2018-03-19 DIAGNOSIS — F900 Attention-deficit hyperactivity disorder, predominantly inattentive type: Secondary | ICD-10-CM

## 2018-04-08 ENCOUNTER — Ambulatory Visit (INDEPENDENT_AMBULATORY_CARE_PROVIDER_SITE_OTHER): Payer: 59 | Admitting: Psychology

## 2018-04-08 DIAGNOSIS — F331 Major depressive disorder, recurrent, moderate: Secondary | ICD-10-CM | POA: Diagnosis not present

## 2018-04-13 ENCOUNTER — Ambulatory Visit (INDEPENDENT_AMBULATORY_CARE_PROVIDER_SITE_OTHER): Payer: 59 | Admitting: Psychology

## 2018-04-13 DIAGNOSIS — F4310 Post-traumatic stress disorder, unspecified: Secondary | ICD-10-CM | POA: Diagnosis not present

## 2018-04-13 DIAGNOSIS — F408 Other phobic anxiety disorders: Secondary | ICD-10-CM

## 2018-04-13 DIAGNOSIS — F902 Attention-deficit hyperactivity disorder, combined type: Secondary | ICD-10-CM | POA: Diagnosis not present

## 2018-04-13 DIAGNOSIS — F3342 Major depressive disorder, recurrent, in full remission: Secondary | ICD-10-CM

## 2018-04-14 ENCOUNTER — Encounter: Payer: Self-pay | Admitting: Family Medicine

## 2018-04-14 DIAGNOSIS — F3342 Major depressive disorder, recurrent, in full remission: Secondary | ICD-10-CM | POA: Insufficient documentation

## 2018-04-14 DIAGNOSIS — F902 Attention-deficit hyperactivity disorder, combined type: Secondary | ICD-10-CM | POA: Insufficient documentation

## 2018-04-15 ENCOUNTER — Ambulatory Visit (INDEPENDENT_AMBULATORY_CARE_PROVIDER_SITE_OTHER): Payer: 59 | Admitting: Physician Assistant

## 2018-04-15 VITALS — BP 108/75 | HR 66

## 2018-04-15 DIAGNOSIS — G43009 Migraine without aura, not intractable, without status migrainosus: Secondary | ICD-10-CM

## 2018-04-15 MED ORDER — DEXAMETHASONE SODIUM PHOSPHATE 10 MG/ML IJ SOLN
4.0000 mg | Freq: Once | INTRAMUSCULAR | Status: AC
  Administered 2018-04-15: 4 mg via INTRAMUSCULAR
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
## 2018-04-19 ENCOUNTER — Ambulatory Visit (INDEPENDENT_AMBULATORY_CARE_PROVIDER_SITE_OTHER): Payer: 59 | Admitting: Family Medicine

## 2018-04-19 DIAGNOSIS — R52 Pain, unspecified: Secondary | ICD-10-CM

## 2018-05-11 NOTE — Progress Notes (Signed)
Note opened in error.

## 2018-05-18 ENCOUNTER — Encounter: Payer: Self-pay | Admitting: Physician Assistant

## 2018-05-18 ENCOUNTER — Ambulatory Visit (INDEPENDENT_AMBULATORY_CARE_PROVIDER_SITE_OTHER): Payer: 59 | Admitting: Physician Assistant

## 2018-05-18 VITALS — BP 119/71 | HR 59 | Ht 65.5 in | Wt 199.0 lb

## 2018-05-18 DIAGNOSIS — L03011 Cellulitis of right finger: Secondary | ICD-10-CM

## 2018-05-18 MED ORDER — DOXYCYCLINE HYCLATE 100 MG PO TABS: 100.0000 mg | ORAL_TABLET | Freq: Two times a day (BID) | ORAL | 0 refills | Status: DC

## 2018-05-18 MED FILL — DOXYCYCLINE HYCLATE 100 MG: 100 | 10 days supply | Qty: 20 | Fill #0

## 2018-05-18 NOTE — Progress Notes (Signed)
   Subjective:    Patient ID: Paula MackieEvonia Meinhart, female    DOB: 1991-07-29, 29 y.o.   MRN: [id number]  HPI  Pt is a 27 yo female who presents to the clinic with right middle finger very tender and swollen on the medial nail bed. She has noticed pain since Friday. It has continued to get worse. It throbs at night. She denies any trauma. She does have a habit of pulling back hang nails. She has not tried anything to make better. Denies any fever, chills, body aches. She has not noticed any discharge from finger.   .. Active Ambulatory Problems    Diagnosis Date Noted  . Vitamin D deficiency 11/26/2016  . Class 1 obesity without serious comorbidity with body mass index (BMI) of 30.0 to 30.9 in adult 11/26/2016  . Supervision of high risk pregnancy, antepartum 12/09/2016  . History of preterm delivery, currently pregnant 12/11/2016  . History of abnormal cervical Pap smear 12/12/2016  . Cervical cyst 12/12/2016  . Subchorionic hemorrhage 01/13/2017  . Meralgia paraesthetica, right 02/24/2017  . Quadriceps weakness 02/24/2017  . Migraine without aura and without status migrainosus, not intractable 04/17/2017  . Pregnancy headache, antepartum 04/17/2017  . Muscle spasm 04/17/2017  . Desires tubal ligation  05/22/2017  . Indication for care in labor or delivery 07/16/2017  . SVD (spontaneous vaginal delivery) 07/18/2017  . Status post tubal ligation 07/18/2017  . Anxiety and depression 09/24/2017  . Inattention 01/13/2018  . Major depression, recurrent, full remission (HCC)   . ADHD (attention deficit hyperactivity disorder), combined type    Resolved Ambulatory Problems    Diagnosis Date Noted  . History of preterm delivery   . Round ligament pain   . [redacted] weeks gestation of pregnancy    Past Medical History:  Diagnosis Date  . Anxiety   . GERD (gastroesophageal reflux disease)   . Headache   . History of preterm labor   . Prediabetes   . PTSD (post-traumatic stress disorder)   .  Vaginal Pap smear, abnormal       Review of Systems See HPI.     Objective:   Physical Exam  Constitutional: She appears well-developed and well-nourished.  HENT:  Head: Normocephalic and atraumatic.  Musculoskeletal:  Right middle finger medial nail bed swollen with blanched appearance. She does have a hang nail in the medial nail bed and what appears to be a hyperpigmented area that extends from hang nail into nail bed. VERY tender. Slightly warm to touch. I was able to express some pus under nail bed with manipulation.   Psychiatric: She has a normal mood and affect. Her behavior is normal.          Assessment & Plan:  Marland Kitchen.Marland Kitchen.Diagnoses and all orders for this visit:  Paronychia of right middle finger -     doxycycline (VIBRA-TABS) 100 MG tablet; Take 1 tablet (100 mg total) by mouth 2 (two) times daily.  abx given. Soak in warm epson salt water. Keep dry. Follow up as needed or if not starting to improve. HO given.

## 2018-05-18 NOTE — Patient Instructions (Signed)
Paronychia  Paronychia is an infection of the skin. It happens near a fingernail or toenail. It may cause pain and swelling around the nail. Usually, it is not serious and it clears up with treatment.  Follow these instructions at home:   Soak the fingers or toes in warm water as told by your doctor. You may be told to do this for 20 minutes, 2-3 times a day.   Keep the area dry when you are not soaking it.   Take medicines only as told by your doctor.   If you were given an antibiotic medicine, finish all of it even if you start to feel better.   Keep the affected area clean.   Do not try to drain a fluid-filled bump yourself.   Wear rubber gloves when putting your hands in water.   Wear gloves if your hands might touch cleaners or chemicals.   Follow your doctor's instructions about:  ? Wound care.  ? Bandage (dressing) changes and removal.  Contact a doctor if:   Your symptoms get worse or do not improve.   You have a fever or chills.   You have redness spreading from the affected area.   You have more fluid, blood, or pus coming from the affected area.   Your finger or knuckle is swollen or is hard to move.  This information is not intended to replace advice given to you by your health care provider. Make sure you discuss any questions you have with your health care provider.  Document Released: 08/06/2009 Document Revised: 01/24/2016 Document Reviewed: 07/26/2014  Elsevier Interactive Patient Education  2018 [employer]

## 2018-05-19 ENCOUNTER — Encounter: Payer: Self-pay | Admitting: Physician Assistant

## 2018-05-20 ENCOUNTER — Ambulatory Visit (INDEPENDENT_AMBULATORY_CARE_PROVIDER_SITE_OTHER): Payer: 59 | Admitting: Psychology

## 2018-05-20 DIAGNOSIS — F331 Major depressive disorder, recurrent, moderate: Secondary | ICD-10-CM

## 2018-06-01 ENCOUNTER — Other Ambulatory Visit: Payer: Self-pay | Admitting: *Deleted

## 2018-06-01 LAB — HEMOGLOBIN A1C: A1c: 5.5

## 2018-06-24 ENCOUNTER — Ambulatory Visit (INDEPENDENT_AMBULATORY_CARE_PROVIDER_SITE_OTHER): Payer: 59 | Admitting: Psychology

## 2018-06-24 DIAGNOSIS — F331 Major depressive disorder, recurrent, moderate: Secondary | ICD-10-CM | POA: Diagnosis not present

## 2018-07-08 ENCOUNTER — Ambulatory Visit (INDEPENDENT_AMBULATORY_CARE_PROVIDER_SITE_OTHER): Payer: 59

## 2018-07-08 DIAGNOSIS — N898 Other specified noninflammatory disorders of vagina: Secondary | ICD-10-CM

## 2018-07-08 DIAGNOSIS — B9689 Other specified bacterial agents as the cause of diseases classified elsewhere: Secondary | ICD-10-CM

## 2018-07-08 DIAGNOSIS — N760 Acute vaginitis: Secondary | ICD-10-CM | POA: Diagnosis not present

## 2018-07-08 DIAGNOSIS — B373 Candidiasis of vulva and vagina: Secondary | ICD-10-CM | POA: Diagnosis not present

## 2018-07-08 DIAGNOSIS — Z113 Encounter for screening for infections with a predominantly sexual mode of transmission: Secondary | ICD-10-CM

## 2018-07-08 NOTE — Progress Notes (Signed)
PT c/o vaginal discharge and irritation for 2 days. PT desires wet prep and STD testing. PT declines blood work. Self swab done and Aptima sent.

## 2018-07-09 LAB — CERVICOVAGINAL ANCILLARY ONLY
BACTERIAL VAGINITIS: POSITIVE — AB
CANDIDA VAGINITIS: POSITIVE — AB
CHLAMYDIA, DNA PROBE: NEGATIVE
Neisseria Gonorrhea: NEGATIVE
Trichomonas: NEGATIVE

## 2018-07-12 ENCOUNTER — Telehealth: Payer: Self-pay

## 2018-07-12 DIAGNOSIS — B379 Candidiasis, unspecified: Secondary | ICD-10-CM

## 2018-07-12 DIAGNOSIS — N760 Acute vaginitis: Secondary | ICD-10-CM

## 2018-07-12 DIAGNOSIS — B9689 Other specified bacterial agents as the cause of diseases classified elsewhere: Secondary | ICD-10-CM

## 2018-07-12 MED ORDER — FLUCONAZOLE 150 MG PO TABS: 150.0000 mg | ORAL_TABLET | Freq: Once | ORAL | 0 refills | Status: AC

## 2018-07-12 MED ORDER — METRONIDAZOLE 500 MG PO TABS: 500.0000 mg | ORAL_TABLET | Freq: Two times a day (BID) | ORAL | 0 refills | Status: DC

## 2018-07-12 MED FILL — FLUCONAZOLE 150 MG TABS: 150 | 1 days supply | Qty: 1 | Fill #0

## 2018-07-12 MED FILL — metroNIDAZOLE 500 MG TABS: 500 | 7 days supply | Qty: 14 | Fill #0

## 2018-07-12 NOTE — Telephone Encounter (Signed)
MyChart message sent to pt about positive BV and yeast infection.

## 2018-08-19 ENCOUNTER — Ambulatory Visit: Payer: 59 | Admitting: Psychology

## 2018-08-31 IMAGING — US US MFM OB DETAIL+14 WK
1 series · 14 of 28 positions shown · non-contrast
Comparison: none

[Series 1: us mfm ob detail+14 wk · 14 of 90 slices shown]
[im 4/90]
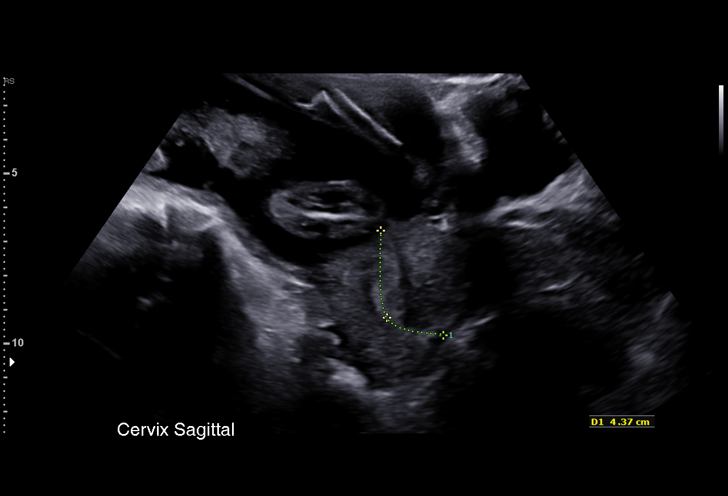
[im 10/90]
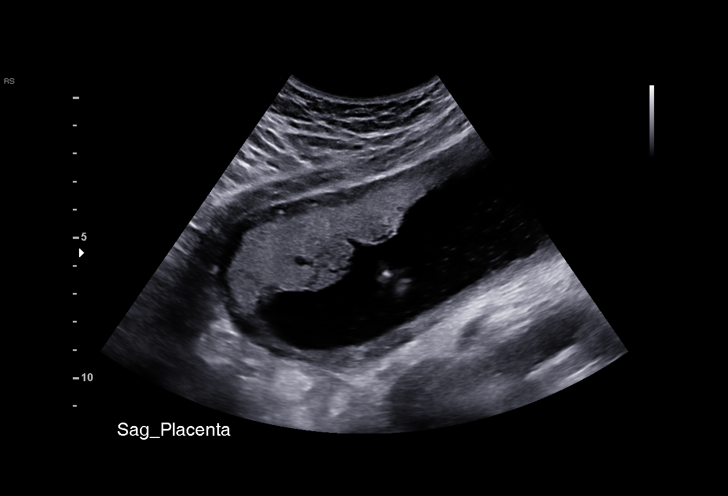
[im 17/90]
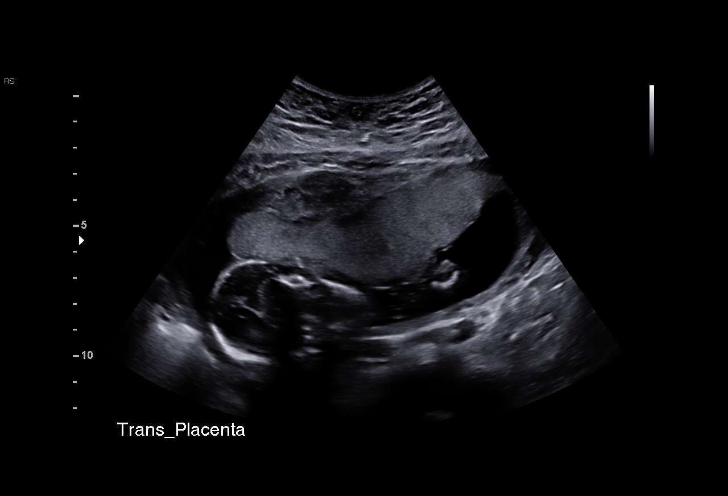
[im 24/90]
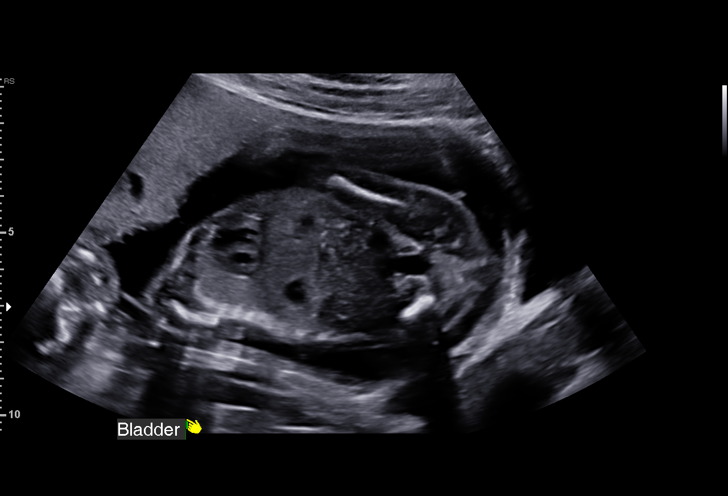
[im 30/90]
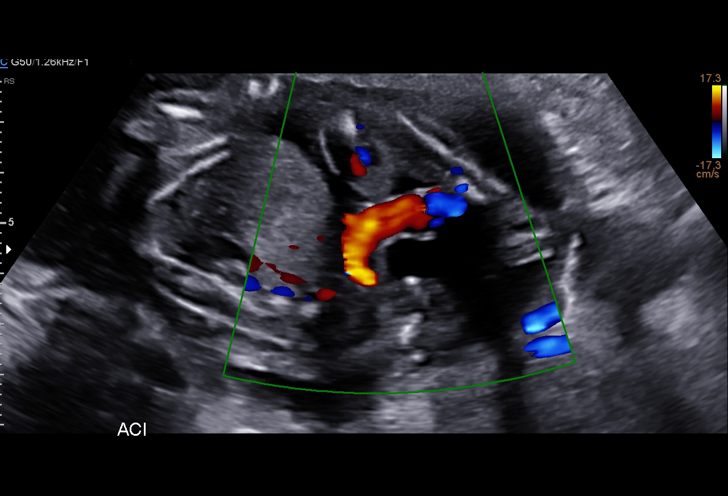
[im 37/90]
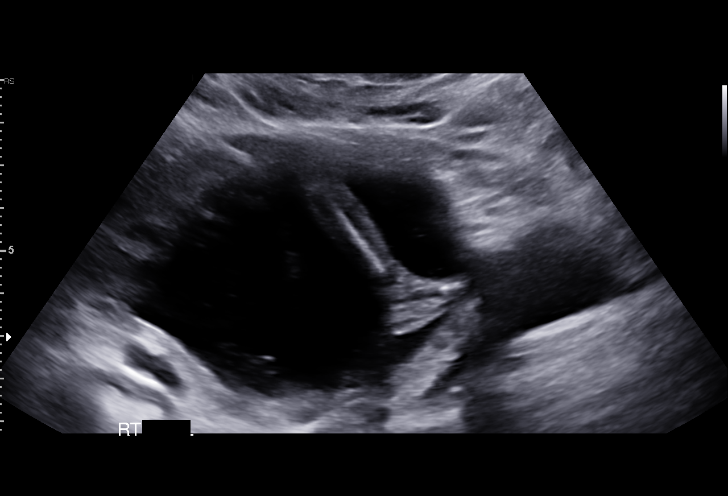
[im 43/90]
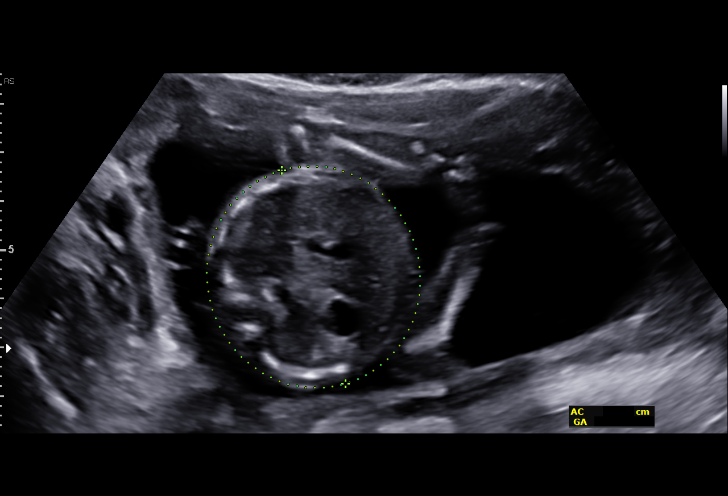
[im 50/90]
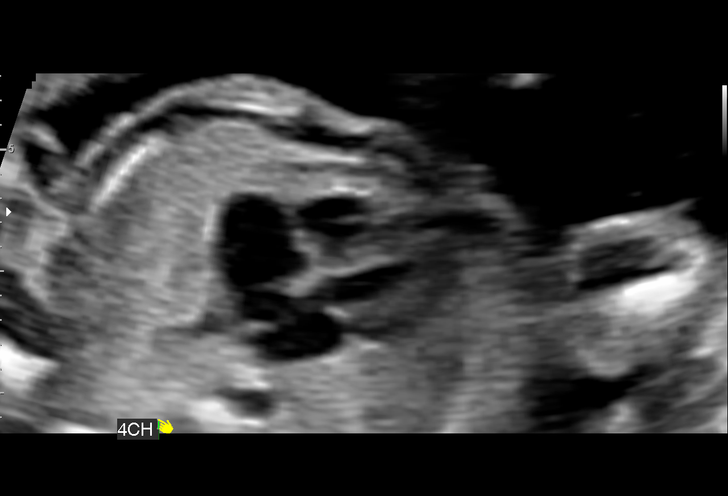
[im 57/90]
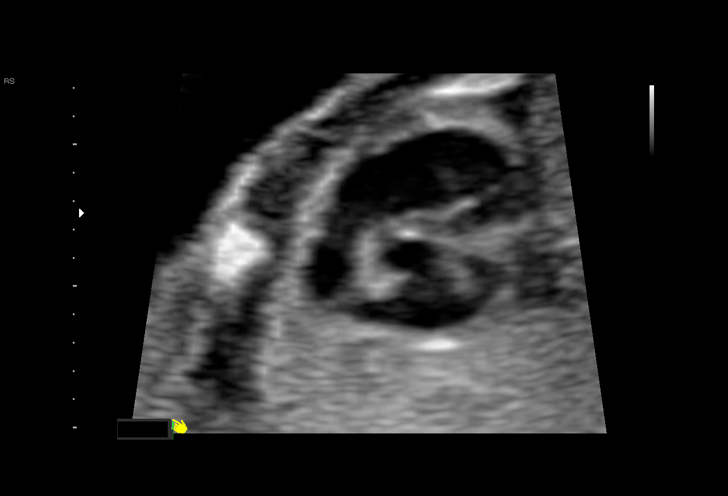
[im 63/90]
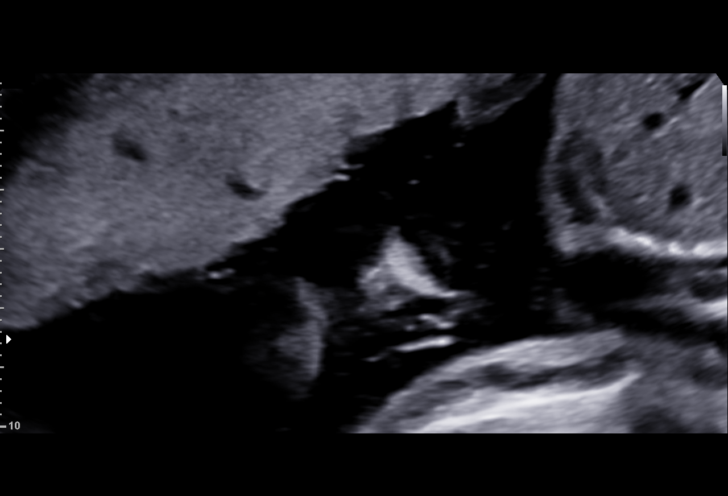
[im 70/90]
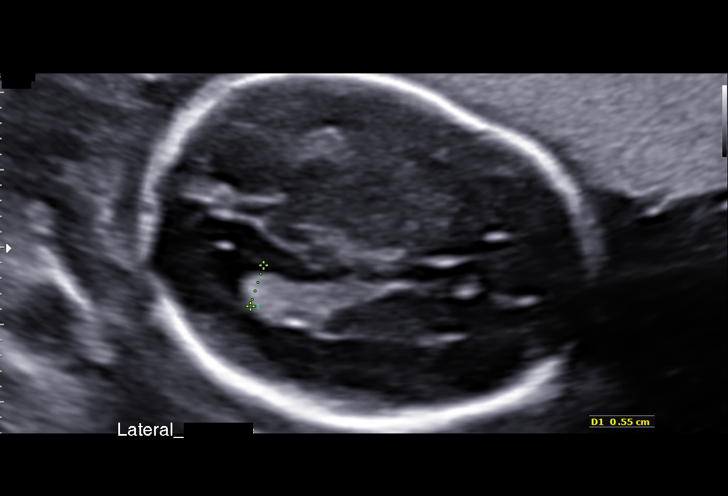
[im 76/90]
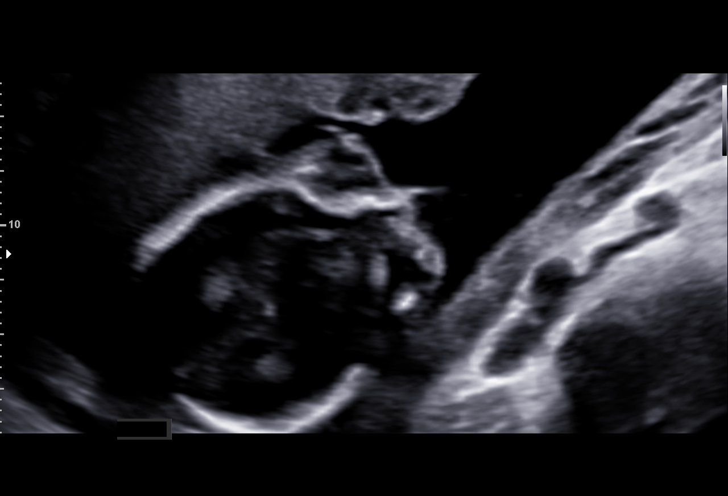
[im 83/90]
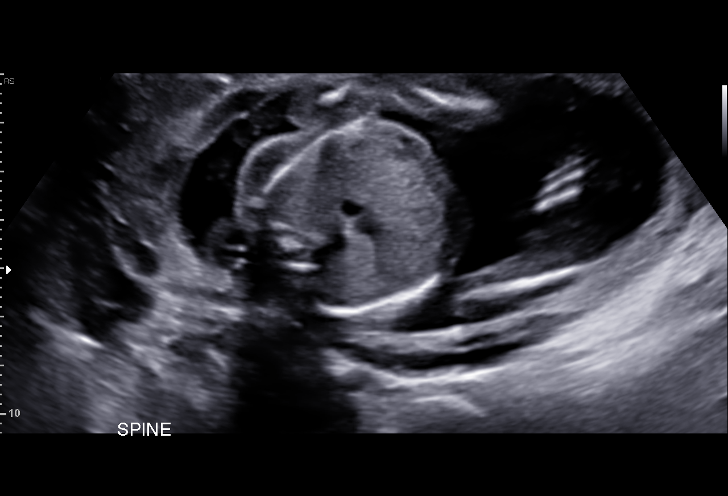
[im 90/90]
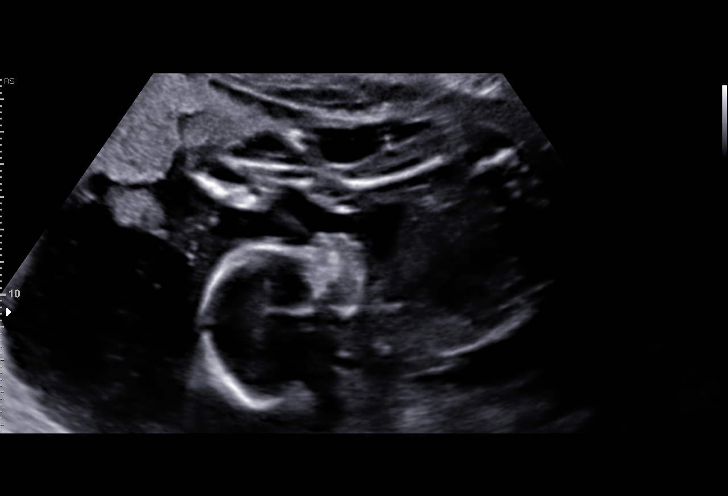

[14 of 28 positions shown; findings below may reference images not displayed]

for [REDACTED]care

1  VRAJ JANSSEN           [id number]      [id number]     [id number]
Indications

19 weeks gestation of pregnancy
Poor obstetric history: Previous preterm
delivery x 2  (27, 36 wks)
Encounter for antenatal screening for
malformations
Obesity complicating pregnancy, second
trimester
Low risk NIPS
OB History

Blood Type:            Height:  5'5"   Weight (lb):  191      BMI:
Gravidity:    3         Term:   0        Prem:   2        SAB:   0
TOP:          0       Ectopic:  0        Living: 2
Fetal Evaluation

Num Of Fetuses:     1
Fetal Heart         142
Rate(bpm):
Cardiac Activity:   Observed
Presentation:       Frank breech
Placenta:           Anterior Fundal, above cervical os
P. Cord Insertion:  Visualized, central

Amniotic Fluid
AFI FV:      Subjectively within normal limits

Largest Pocket(cm)
5.12
Biometry
BPD:      43.6  mm     G. Age:  19w 1d         40  %    CI:        67.14   %   70 - 86
FL/HC:      17.8   %   16.1 -
HC:      170.4  mm     G. Age:  19w 4d         53  %    HC/AC:      1.21       1.09 -
AC:      141.1  mm     G. Age:  19w 4d         47  %    FL/BPD:     69.7   %
FL:       30.4  mm     G. Age:  19w 3d         42  %    FL/AC:      21.5   %   20 - 24
HUM:      30.1  mm     G. Age:  20w 0d         64  %
CER:        20  mm     G. Age:  19w 0d         43  %

CM:        3.6  mm
Est. FW:     294  gm    0 lb 10 oz      47  %
Gestational Age

LMP:           19w 3d       Date:   10/24/16                 EDD:   07/31/17
U/S Today:     19w 3d                                        EDD:   07/31/17
Best:          19w 3d    Det. By:   LMP  (10/24/16)          EDD:   07/31/17
Anatomy

Cranium:               Appears normal         Aortic Arch:            Appears normal
Cavum:                 Appears normal         Ductal Arch:            Appears normal
Ventricles:            Appears normal         Diaphragm:              Appears normal
Choroid Plexus:        Appears normal         Stomach:                Appears normal, left
sided
Cerebellum:            Appears normal         Abdomen:                Appears normal
Posterior Fossa:       Appears normal         Abdominal Wall:         Appears nml (cord
insert, abd wall)
Nuchal Fold:           Not well visualized    Cord Vessels:           Appears normal (3
vessel cord)
Face:                  Not well visualized    Kidneys:                Appear normal
Lips:                  Appears normal         Bladder:                Appears normal
Thoracic:              Appears normal         Spine:                  Not well visualized
Heart:                 Appears normal         Upper Extremities:      Appears normal
(4CH, axis, and
situs)
RVOT:                  Not well visualized    Lower Extremities:      Appears normal
LVOT:                  Appears normal

Other:  Fetus appears to be a female. Heels and right 5th digit visualized.
Technically difficult due to fetal position.
Cervix Uterus Adnexa

Cervix
Length:           4.37  cm.
Normal appearance by transabdominal scan.

Uterus
No abnormality visualized.

Left Ovary
Within normal limits.

Right Ovary
Within normal limits.

Adnexa:       No abnormality visualized. No adnexal mass
visualized.
Impression

SIUP at 19+3 weeks
Normal detailed fetal anatomy; limited views of  face, RVOT
and spine
Markers of aneuploidy: none
Normal amniotic fluid volume
Measurements consistent with LMP dating
TA views of cervix: normal length without funneling
Recommendations

Follow-up ultrasound in 4-6 weeks to complete anatomy
survey

## 2018-09-17 ENCOUNTER — Ambulatory Visit (INDEPENDENT_AMBULATORY_CARE_PROVIDER_SITE_OTHER): Payer: 59 | Admitting: Family Medicine

## 2018-09-17 ENCOUNTER — Encounter: Payer: Self-pay | Admitting: Family Medicine

## 2018-09-17 ENCOUNTER — Other Ambulatory Visit (HOSPITAL_COMMUNITY)
Admission: RE | Admit: 2018-09-17 | Discharge: 2018-09-17 | Disposition: A | Discharge status: Home / Self Care | Payer: 59 | Source: Ambulatory Visit | Attending: Family Medicine | Admitting: Family Medicine

## 2018-09-17 VITALS — BP 108/74 | HR 76 | Temp 98.7°F | Ht 66.0 in | Wt 190.2 lb

## 2018-09-17 DIAGNOSIS — Z114 Encounter for screening for human immunodeficiency virus [HIV]: Secondary | ICD-10-CM | POA: Diagnosis not present

## 2018-09-17 DIAGNOSIS — L989 Disorder of the skin and subcutaneous tissue, unspecified: Secondary | ICD-10-CM | POA: Diagnosis not present

## 2018-09-17 DIAGNOSIS — Z113 Encounter for screening for infections with a predominantly sexual mode of transmission: Secondary | ICD-10-CM | POA: Insufficient documentation

## 2018-09-17 LAB — HEMOGLOBIN A1C: Hgb A1c MFr Bld: 5.5 % (ref 4.6–6.5)

## 2018-09-17 NOTE — Progress Notes (Signed)
Pre visit review using our clinic review tool, if applicable. No additional management support is needed unless otherwise documented below in the visit note. 

## 2018-09-17 NOTE — Patient Instructions (Signed)
Give [place] 2-3 business days to get the results of your labs back.   We will plan to take a small piece of the skin and send it to the lab if the blood work is normal.   Let us know if you need anything.
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
## 2018-09-18 LAB — HIV ANTIBODY (ROUTINE TESTING W REFLEX): HIV 1&2 Ab, 4th Generation: NONREACTIVE

## 2018-09-20 LAB — URINE CYTOLOGY ANCILLARY ONLY
CHLAMYDIA, DNA PROBE: NEGATIVE
Neisseria Gonorrhea: NEGATIVE
Trichomonas: NEGATIVE

## 2018-09-22 ENCOUNTER — Encounter: Payer: Self-pay | Admitting: Family Medicine

## 2018-09-22 ENCOUNTER — Ambulatory Visit (INDEPENDENT_AMBULATORY_CARE_PROVIDER_SITE_OTHER): Payer: 59 | Admitting: Family Medicine

## 2018-09-22 DIAGNOSIS — L989 Disorder of the skin and subcutaneous tissue, unspecified: Secondary | ICD-10-CM | POA: Diagnosis not present

## 2018-09-22 DIAGNOSIS — L309 Dermatitis, unspecified: Secondary | ICD-10-CM | POA: Diagnosis not present

## 2018-09-22 NOTE — Patient Instructions (Signed)
Do not shower for the rest of the day. When you do wash it, use only soap and water. Do not vigorously scrub. Apply triple antibiotic ointment (like Neosporin) twice daily. Keep the area clean and dry.   Things to look out for: increasing pain not relieved by ibuprofen/acetaminophen, fevers, spreading redness, drainage of pus, or foul odor.  Give us 1 week to get results of your biopsy back.  Let us know if you need anything.  

## 2018-09-22 NOTE — Addendum Note (Signed)
Addended by: Scharlene Gloss B on: 09/22/2018 03:33 PM   Modules accepted: Orders

## 2018-09-22 NOTE — Progress Notes (Signed)
Chief Complaint  Patient presents with  . Procedure   Here for biopsy of skin lesion on neck. Failed conservative therapy, opted for bx rather than referral at this time.  Procedure note; punch biopsy  Informed consent was obtained. The area was cleaned with alcohol and then injected with 1 mL of 1% lidocaine with epinephrine. The area was then cleaned with Hibiclens. Vertical traction was placed along the skin and a 3 mm punch biopsy was used. The specimen was grasped with forceps and separated with iris scissors. The specimen was placed in a sterile specimen cup and sent to the lab. No sutures placed. There were no complications noted. The patient tolerated the procedure well.  Skin lesion of neck  Aftercare instructions verbalized and written down. 1 week for results of biopsy. F/u pending above. Pt voiced understanding and agreement to the plan.  Paula Huff 3:12 PM 09/22/18

## 2018-09-27 ENCOUNTER — Other Ambulatory Visit: Payer: Self-pay | Admitting: Family Medicine

## 2018-09-27 MED ORDER — CLOBETASOL PROPIONATE 0.05 % EX CREA: 1.0000 "application " | TOPICAL_CREAM | Freq: Two times a day (BID) | CUTANEOUS | 0 refills | Status: AC

## 2018-09-27 MED FILL — CLOBETASOL PROPIONATE 0.05: 0.05 | 28 days supply | Qty: 60 | Fill #0

## 2018-09-29 ENCOUNTER — Telehealth: Payer: Self-pay | Admitting: *Deleted

## 2018-09-29 NOTE — Telephone Encounter (Signed)
Received Dermatopathology Report results from [facility]; forwarded to provider/SLS 01/29     

## 2018-10-09 IMAGING — US US MFM OB FOLLOW-UP
1 series · 13 of 28 positions shown · non-contrast
Comparison: none

[Series 1: us mfm ob follow-up · 48 acquisitions, 13 frames shown]
[im 2/48]
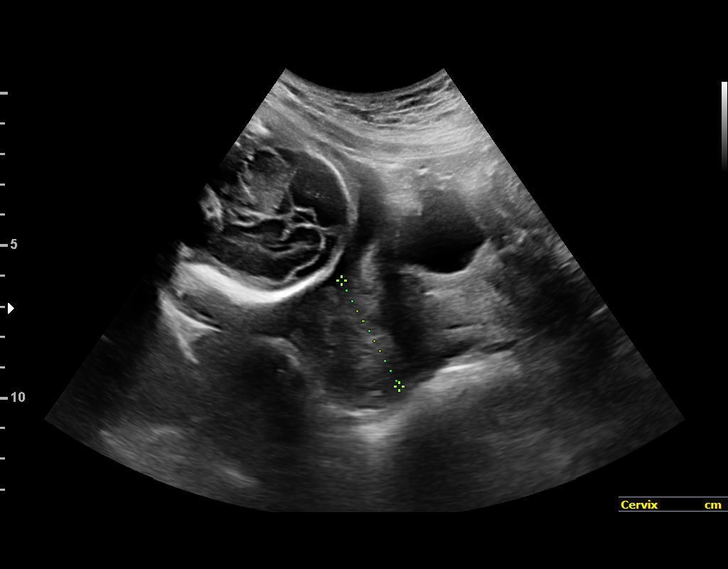
[im 6/48]
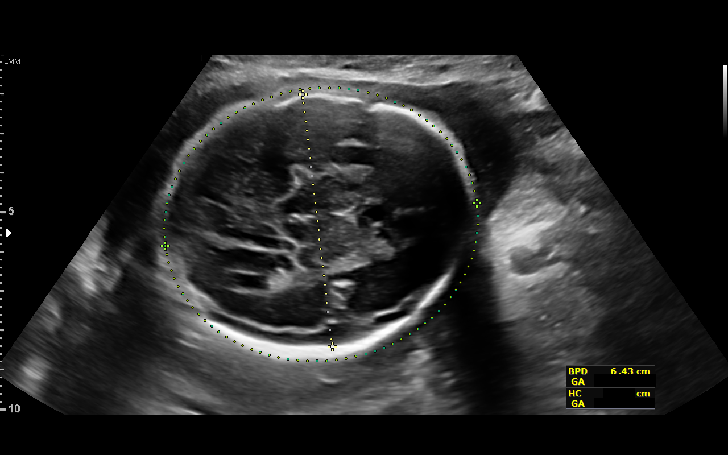
[im 9/48]
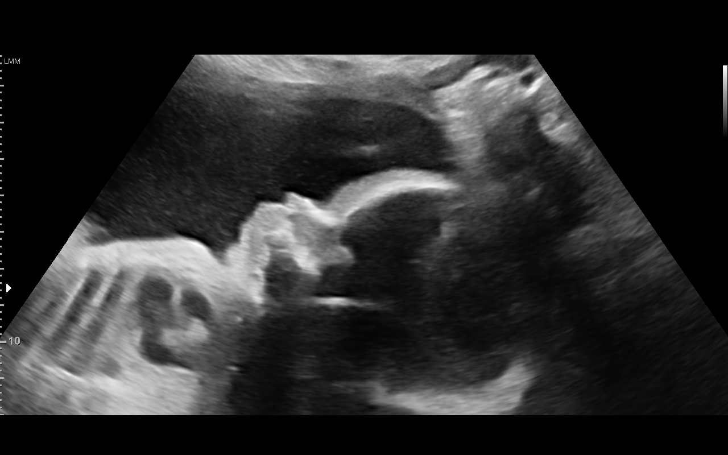
[im 13/48]
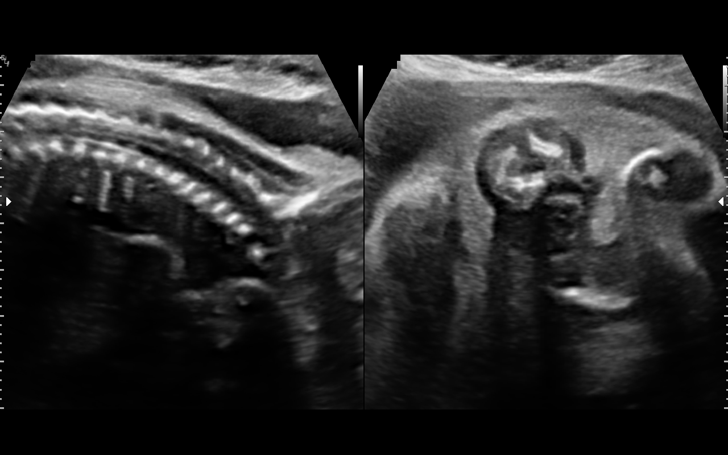
[im 16/48]
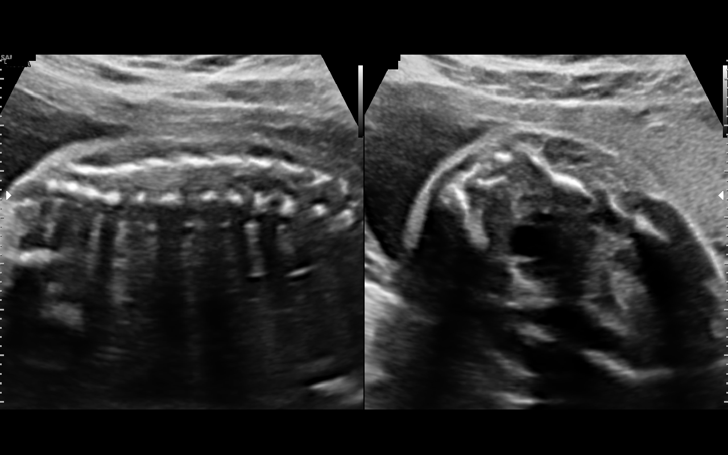
[im 20/48]
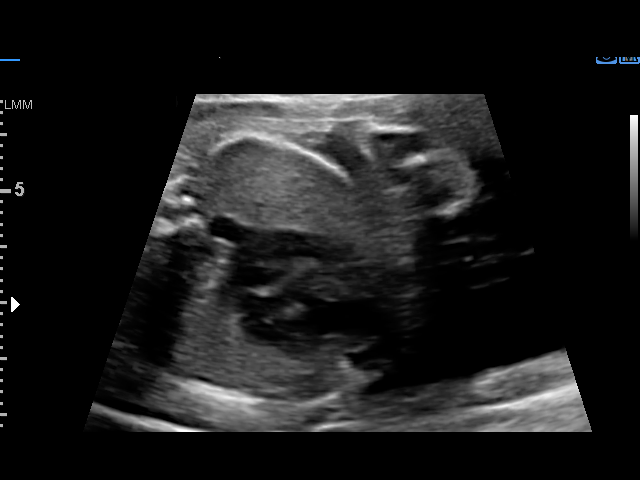
[im 25/48]
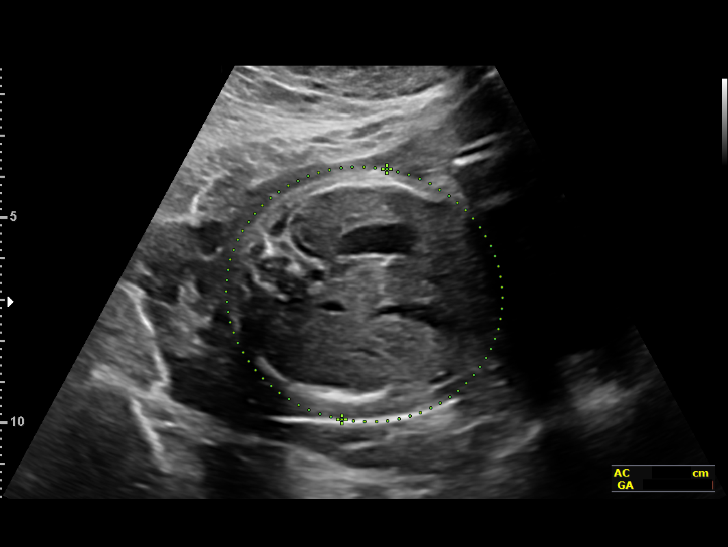
[im 28/48]
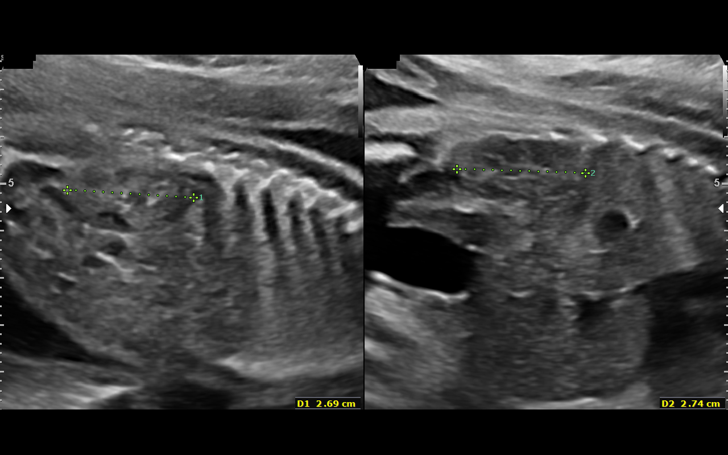
[im 32/48]
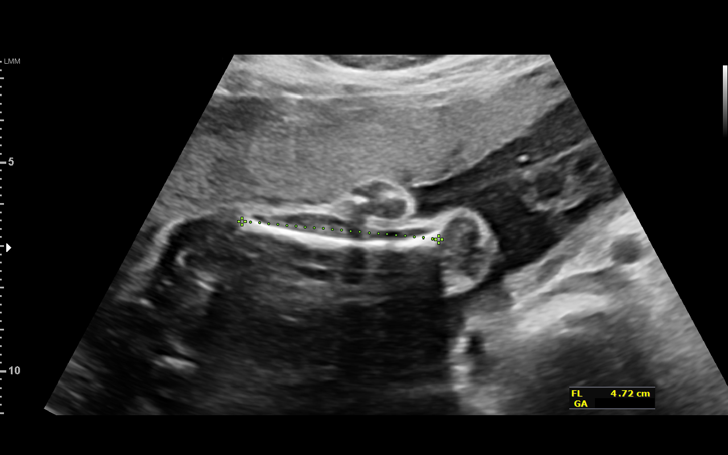
[im 35/48]
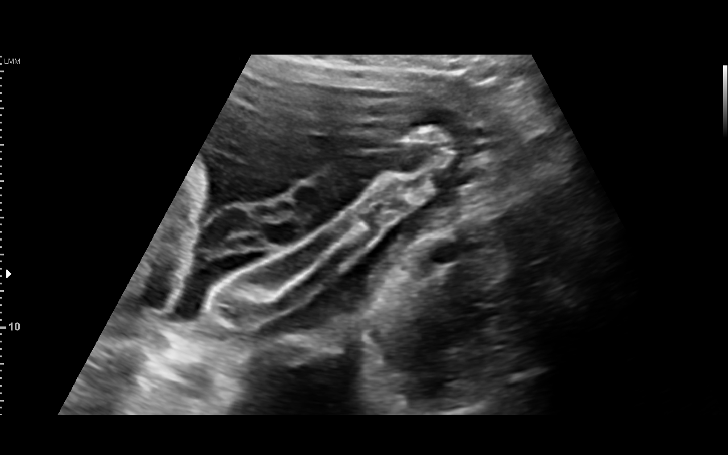
[im 39/48]
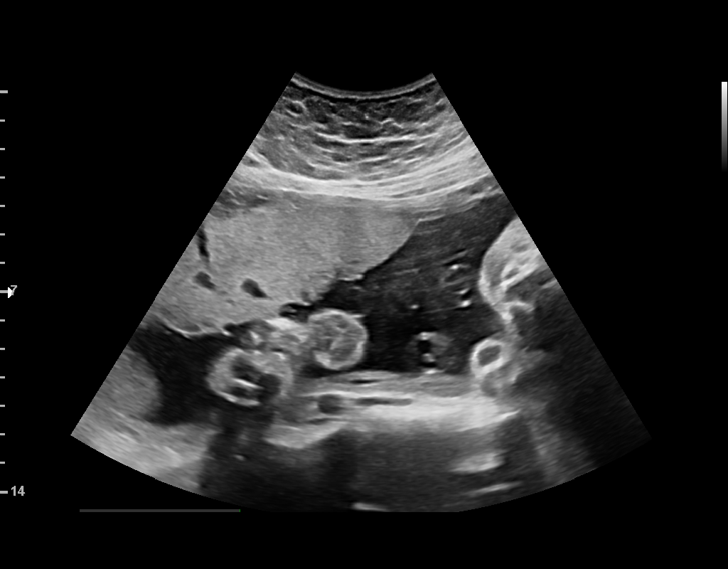
[im 42/48]
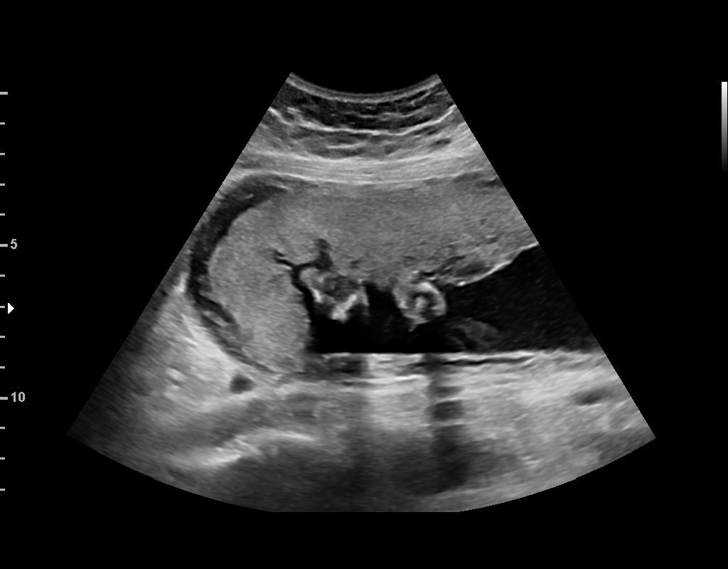
[im 46/48]
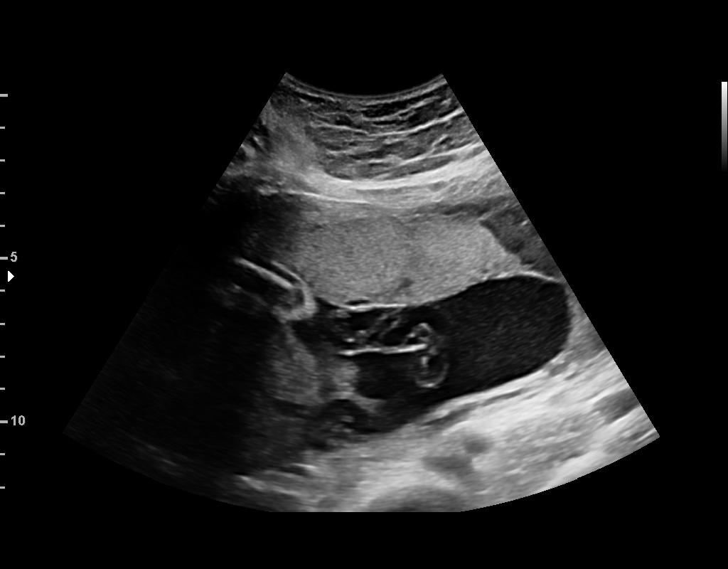

[13 of 28 positions shown; findings below may reference images not displayed]

for [REDACTED]care

1  PER HENRIK LUNDEKVAM           [id number]      [id number]     [id number]
Indications

25 weeks gestation of pregnancy
Poor obstetric history: Previous preterm
delivery x 2  (27, 36 wks)
Obesity complicating pregnancy, second
trimester
Encounter for other antenatal screening
follow-up
OB History

Blood Type:            Height:  5'5"   Weight (lb):  191      BMI:
Gravidity:    3         Term:   0        Prem:   2        SAB:   0
TOP:          0       Ectopic:  0        Living: 2
Fetal Evaluation

Num Of Fetuses:     1
Fetal Heart         148
Rate(bpm):
Cardiac Activity:   Observed
Presentation:       Cephalic
Placenta:           Anterior, above cervical os
P. Cord Insertion:  Previously Visualized

Amniotic Fluid
AFI FV:      Subjectively within normal limits

Largest Pocket(cm)
6.11
Biometry
BPD:      63.7  mm     G. Age:  25w 5d         70  %    CI:        75.88   %   70 - 86
FL/HC:      20.4   %   18.7 -
HC:      231.8  mm     G. Age:  25w 1d         37  %    HC/AC:      1.11       1.04 -
AC:      208.3  mm     G. Age:  25w 3d         53  %    FL/BPD:     74.1   %   71 - 87
FL:       47.2  mm     G. Age:  25w 5d         60  %    FL/AC:      22.7   %   20 - 24
HUM:      43.2  mm     G. Age:  25w 5d         64  %

Est. FW:     820  gm    1 lb 13 oz      63  %
Gestational Age

LMP:           25w 0d       Date:   10/24/16                 EDD:   07/31/17
U/S Today:     25w 4d                                        EDD:   07/27/17
Best:          25w 0d    Det. By:   LMP  (10/24/16)          EDD:   07/31/17
Anatomy

Cranium:               Appears normal         Aortic Arch:            Previously seen
Cavum:                 Appears normal         Ductal Arch:            Previously seen
Ventricles:            Appears normal         Diaphragm:              Previously seen
Choroid Plexus:        Previously seen        Stomach:                Appears normal, left
sided
Cerebellum:            Previously seen        Abdomen:                Appears normal
Posterior Fossa:       Previously seen        Abdominal Wall:         Previously seen
Nuchal Fold:           Not applicable (>20    Cord Vessels:           Previously seen
wks GA)
Face:                  Appears normal         Kidneys:                Appear normal
(orbits and profile)
Lips:                  Appears normal         Bladder:                Appears normal
Thoracic:              Appears normal         Spine:                  Appears normal
Heart:                 Appears normal         Upper Extremities:      Previously seen
(4CH, axis, and
situs)
RVOT:                  Appears normal         Lower Extremities:      Previously seen
LVOT:                  Previously seen

Other:  Fetus appears to be a female. Heels and right 5th digit previously
visualized. Technically difficult due to fetal position and maternal body
habitus.
Cervix Uterus Adnexa

Cervix
Length:           3.94  cm.
Normal appearance by transabdominal scan.

Uterus
No abnormality visualized.
Impression

SIUP at 25+0 weeks here to complete anatomic survey
Normal interval review of fetal anatomy; all relevant anatomy
has been visualized
Normal amniotic fluid volume
Growth is normal in the 63rd percentile
Transabdomincal cervix length is 39mm  without funneling
Recommendations

Follow-up ultrasounds as clinically indicated.

## 2018-10-28 ENCOUNTER — Ambulatory Visit (INDEPENDENT_AMBULATORY_CARE_PROVIDER_SITE_OTHER): Payer: 59 | Admitting: Sports Medicine

## 2018-10-28 ENCOUNTER — Encounter: Payer: Self-pay | Admitting: Sports Medicine

## 2018-10-28 DIAGNOSIS — M7652 Patellar tendinitis, left knee: Secondary | ICD-10-CM | POA: Insufficient documentation

## 2018-10-28 DIAGNOSIS — M7650 Patellar tendinitis, unspecified knee: Secondary | ICD-10-CM | POA: Diagnosis not present

## 2018-10-28 MED ORDER — MELOXICAM 15 MG PO TABS: ORAL_TABLET | ORAL | 3 refills | Status: DC
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
## 2018-10-28 NOTE — Assessment & Plan Note (Signed)
Discrete tenderness directly at the patellar origin of the patellar tendon. Adding a Cho-Pat strap, meloxicam, rehab exercises. Recheck in 4 to 6 weeks.

## 2018-11-05 ENCOUNTER — Other Ambulatory Visit: Payer: Self-pay | Admitting: Physician Assistant

## 2018-11-05 DIAGNOSIS — Z20828 Contact with and (suspected) exposure to other viral communicable diseases: Secondary | ICD-10-CM

## 2018-11-05 DIAGNOSIS — J069 Acute upper respiratory infection, unspecified: Secondary | ICD-10-CM

## 2018-11-05 MED ORDER — OSELTAMIVIR PHOSPHATE 75 MG PO CAPS: 75.0000 mg | ORAL_CAPSULE | Freq: Two times a day (BID) | ORAL | 0 refills | Status: AC

## 2018-11-25 ENCOUNTER — Ambulatory Visit: Payer: 59 | Admitting: Psychology

## 2019-02-10 ENCOUNTER — Encounter: Payer: Self-pay | Admitting: Obstetrics & Gynecology

## 2019-02-10 ENCOUNTER — Ambulatory Visit (INDEPENDENT_AMBULATORY_CARE_PROVIDER_SITE_OTHER): Payer: 59 | Admitting: Obstetrics & Gynecology

## 2019-02-10 ENCOUNTER — Other Ambulatory Visit: Payer: Self-pay

## 2019-02-10 VITALS — BP 111/60 | HR 61 | Resp 16 | Ht 66.0 in | Wt 174.0 lb

## 2019-02-10 DIAGNOSIS — N920 Excessive and frequent menstruation with regular cycle: Secondary | ICD-10-CM | POA: Diagnosis not present

## 2019-02-10 MED ORDER — NORGESTREL-ETHINYL ESTRADIOL 0.3-30 MG-MCG PO TABS: 1.0000 | ORAL_TABLET | Freq: Every day | ORAL | 11 refills | Status: DC

## 2019-02-10 MED FILL — ELINEST-28 TABLET: 0.3-30 | 84 days supply | Qty: 84 | Fill #0
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
## 2019-02-11 LAB — CBC
HCT: 37.2 % (ref 35.0–45.0)
Hemoglobin: 12.2 g/dL (ref 11.7–15.5)
MCH: 28.2 pg (ref 27.0–33.0)
MCHC: 32.8 g/dL (ref 32.0–36.0)
MCV: 85.9 fL (ref 80.0–100.0)
MPV: 11 fL (ref 7.5–12.5)
Platelets: 382 10*3/uL (ref 140–400)
RBC: 4.33 10*6/uL (ref 3.80–5.10)
RDW: 13.3 % (ref 11.0–15.0)
WBC: 6.4 10*3/uL (ref 3.8–10.8)

## 2019-02-11 LAB — TSH: TSH: 1.32 mIU/L

## 2019-02-17 ENCOUNTER — Encounter: Payer: Self-pay | Admitting: Family Medicine

## 2019-02-18 ENCOUNTER — Other Ambulatory Visit: Payer: Self-pay

## 2019-02-18 ENCOUNTER — Ambulatory Visit (INDEPENDENT_AMBULATORY_CARE_PROVIDER_SITE_OTHER): Payer: 59

## 2019-02-18 DIAGNOSIS — N920 Excessive and frequent menstruation with regular cycle: Secondary | ICD-10-CM | POA: Diagnosis not present

## 2019-03-02 ENCOUNTER — Encounter: Payer: Self-pay | Admitting: Family Medicine

## 2019-03-02 ENCOUNTER — Ambulatory Visit (INDEPENDENT_AMBULATORY_CARE_PROVIDER_SITE_OTHER): Payer: 59 | Admitting: Family Medicine

## 2019-03-02 ENCOUNTER — Other Ambulatory Visit: Payer: Self-pay

## 2019-03-02 VITALS — BP 121/64 | HR 63 | Temp 98.4°F | Resp 18 | Ht 66.0 in | Wt 169.4 lb

## 2019-03-02 DIAGNOSIS — Z0000 Encounter for general adult medical examination without abnormal findings: Secondary | ICD-10-CM | POA: Diagnosis not present

## 2019-03-02 LAB — CBC
HCT: 38.2 % (ref 36.0–46.0)
Hemoglobin: 12.4 g/dL (ref 12.0–15.0)
MCHC: 32.4 g/dL (ref 30.0–36.0)
MCV: 87 fl (ref 78.0–100.0)
Platelets: 315 10*3/uL (ref 150.0–400.0)
RBC: 4.39 Mil/uL (ref 3.87–5.11)
RDW: 14 % (ref 11.5–15.5)
WBC: 5.4 10*3/uL (ref 4.0–10.5)

## 2019-03-02 LAB — COMPREHENSIVE METABOLIC PANEL
ALT: 12 U/L (ref 0–35)
AST: 9 U/L (ref 0–37)
Albumin: 4.5 g/dL (ref 3.5–5.2)
Alkaline Phosphatase: 75 U/L (ref 39–117)
BUN: 8 mg/dL (ref 6–23)
CO2: 24 mEq/L (ref 19–32)
Calcium: 9.4 mg/dL (ref 8.4–10.5)
Chloride: 108 mEq/L (ref 96–112)
Creatinine, Ser: 0.82 mg/dL (ref 0.40–1.20)
GFR: 98.97 mL/min (ref >60.00)
Glucose, Bld: 69 mg/dL — ABNORMAL LOW (ref 70–99)
Potassium: 4 mEq/L (ref 3.5–5.1)
Sodium: 142 mEq/L (ref 135–145)
Total Bilirubin: 0.8 mg/dL (ref 0.2–1.2)
Total Protein: 7.1 g/dL (ref 6.0–8.3)
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
## 2019-03-02 NOTE — Patient Instructions (Addendum)
Give [place] 2-3 business days to get the results of your labs back.   Keep the diet clean and stay active.  Call Dr. Hulan Fray about your issue. If you need to see a GI provider, let me know over [place].   Let us know if you need anything.

## 2019-03-03 LAB — LIPID PANEL
Cholesterol: 102 mg/dL (ref 0–200)
HDL: 46.2 mg/dL (ref >39.00)
LDL Cholesterol: 47 mg/dL (ref 0–99)
NonHDL: 55.73
Total CHOL/HDL Ratio: 2
Triglycerides: 45 mg/dL (ref 0.0–149.0)
VLDL: 9 mg/dL (ref 0.0–40.0)

## 2019-03-24 ENCOUNTER — Encounter: Payer: Self-pay | Admitting: Family Medicine

## 2019-03-24 ENCOUNTER — Other Ambulatory Visit: Payer: Self-pay

## 2019-03-24 DIAGNOSIS — Z20822 Contact with and (suspected) exposure to covid-19: Secondary | ICD-10-CM

## 2019-03-25 ENCOUNTER — Encounter: Payer: Self-pay | Admitting: Gastroenterology

## 2019-03-25 ENCOUNTER — Other Ambulatory Visit: Payer: Self-pay | Admitting: Family Medicine

## 2019-03-25 DIAGNOSIS — R197 Diarrhea, unspecified: Secondary | ICD-10-CM

## 2019-03-25 DIAGNOSIS — K625 Hemorrhage of anus and rectum: Secondary | ICD-10-CM

## 2019-03-25 NOTE — Progress Notes (Signed)
ambu

## 2019-03-27 LAB — NOVEL CORONAVIRUS, NAA: SARS-CoV-2, NAA: NOT DETECTED

## 2019-04-08 ENCOUNTER — Ambulatory Visit (INDEPENDENT_AMBULATORY_CARE_PROVIDER_SITE_OTHER): Payer: 59 | Admitting: Physician Assistant

## 2019-04-08 VITALS — BP 118/75 | HR 60

## 2019-04-08 DIAGNOSIS — Z8349 Family history of other endocrine, nutritional and metabolic diseases: Secondary | ICD-10-CM | POA: Insufficient documentation

## 2019-04-08 DIAGNOSIS — R002 Palpitations: Secondary | ICD-10-CM | POA: Diagnosis not present

## 2019-04-08 DIAGNOSIS — I493 Ventricular premature depolarization: Secondary | ICD-10-CM | POA: Diagnosis not present
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
## 2019-04-11 ENCOUNTER — Other Ambulatory Visit: Payer: Self-pay

## 2019-04-13 ENCOUNTER — Telehealth: Payer: Self-pay

## 2019-04-13 ENCOUNTER — Ambulatory Visit (INDEPENDENT_AMBULATORY_CARE_PROVIDER_SITE_OTHER): Payer: 59 | Admitting: Family Medicine

## 2019-04-13 ENCOUNTER — Other Ambulatory Visit: Payer: Self-pay

## 2019-04-13 ENCOUNTER — Encounter: Payer: Self-pay | Admitting: Family Medicine

## 2019-04-13 VITALS — BP 120/80 | HR 65 | Temp 99.1°F | Ht 65.0 in | Wt 167.1 lb

## 2019-04-13 DIAGNOSIS — R002 Palpitations: Secondary | ICD-10-CM | POA: Diagnosis not present

## 2019-04-13 NOTE — Telephone Encounter (Signed)
Covid-19 screening questions   Do you now or have you had a fever in the last 14 days? No  Do you have any respiratory symptoms of shortness of breath or cough now or in the last 14 days? No  Do you have any family members or close contacts with diagnosed or suspected Covid-19 in the past 14 days? No  Have you been tested for Covid-19 and found to be positive? No        
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
## 2019-04-13 NOTE — Telephone Encounter (Signed)
Called patient to be screened for Friday

## 2019-04-13 NOTE — Patient Instructions (Addendum)
We can start a medicine if you ever desire. It could be for palpitations or it could be for stress.  PVC's are not concerning as far as life-threatening or life-shortening conditions.   Please consider counseling. Contact [phone] to schedule an appointment or inquire about cost/insurance coverage.  Let us know if you need anything.

## 2019-04-15 ENCOUNTER — Other Ambulatory Visit: Payer: Self-pay

## 2019-04-15 ENCOUNTER — Ambulatory Visit (INDEPENDENT_AMBULATORY_CARE_PROVIDER_SITE_OTHER): Payer: 59 | Admitting: Gastroenterology

## 2019-04-15 VITALS — BP 114/80 | HR 81 | Temp 98.3°F | Ht 65.0 in | Wt 167.1 lb

## 2019-04-15 DIAGNOSIS — K625 Hemorrhage of anus and rectum: Secondary | ICD-10-CM | POA: Diagnosis not present

## 2019-04-15 MED ORDER — PEG-KCL-NACL-NASULF-NA ASC-C 100 G PO SOLR: 1.0000 | Freq: Once | ORAL | 0 refills | Status: AC

## 2019-04-15 MED FILL — MOVIPREP POWDER KIT: 100 | 1 days supply | Qty: 1 | Fill #0
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
## 2019-04-15 NOTE — Patient Instructions (Signed)

## 2019-04-21 ENCOUNTER — Telehealth: Payer: Self-pay

## 2019-04-21 NOTE — Telephone Encounter (Signed)
Covid-19 screening questions   Do you now or have you had a fever in the last 14 days?  Do you have any respiratory symptoms of shortness of breath or cough now or in the last 14 days?  Do you have any family members or close contacts with diagnosed or suspected Covid-19 in the past 14 days?  Have you been tested for Covid-19 and found to be positive?      Left message for pt to call back

## 2019-04-22 ENCOUNTER — Ambulatory Visit (AMBULATORY_SURGERY_CENTER): Payer: 59 | Admitting: Gastroenterology

## 2019-04-22 ENCOUNTER — Other Ambulatory Visit: Payer: Self-pay

## 2019-04-22 ENCOUNTER — Encounter: Payer: Self-pay | Admitting: Gastroenterology

## 2019-04-22 VITALS — BP 105/64 | HR 65 | Temp 97.3°F | Resp 18 | Ht 65.0 in | Wt 167.0 lb

## 2019-04-22 DIAGNOSIS — K625 Hemorrhage of anus and rectum: Secondary | ICD-10-CM

## 2019-04-22 DIAGNOSIS — K648 Other hemorrhoids: Secondary | ICD-10-CM

## 2019-04-22 DIAGNOSIS — Z1211 Encounter for screening for malignant neoplasm of colon: Secondary | ICD-10-CM | POA: Diagnosis not present

## 2019-04-22 MED ORDER — SODIUM CHLORIDE 0.9 % IV SOLN: 500.0000 mL | Freq: Once | INTRAVENOUS | Status: DC

## 2019-04-22 MED ORDER — HYDROCORTISONE (PERIANAL) 2.5 % EX CREA: 1.0000 "application " | TOPICAL_CREAM | Freq: Two times a day (BID) | CUTANEOUS | 2 refills | Status: AC

## 2019-04-22 MED FILL — HYDROCORTISONE 2.5% CREAM: 2.5 | 14 days supply | Qty: 30 | Fill #0

## 2019-04-22 NOTE — Progress Notes (Signed)
Pt's states no medical or surgical changes since previsit or office visit.  JB temps and CW vitals. 

## 2019-04-22 NOTE — Progress Notes (Signed)
A and O x3. Report to RN. Tolerated MAC anesthesia well.

## 2019-04-22 NOTE — Op Note (Signed)
[facility] Patient Name: Paula Huff Procedure Date: 04/22/2019 7:24 AM MRN: [id number] Endoscopist: Lynann Bolognaajesh Shaquana Buel , MD Age: 28 Referring MD:  Date of Birth: 03-13-1991 Gender: Female Account #: [id number] Procedure:                Colonoscopy Indications:              Rectal bleeding Medicines:                Monitored Anesthesia Care Procedure:                Pre-Anesthesia Assessment:                           - Prior to the procedure, a History and Physical                            was performed, and patient medications and                            allergies were reviewed. The patient's tolerance of                            previous anesthesia was also reviewed. The risks                            and benefits of the procedure and the sedation                            options and risks were discussed with the patient.                            All questions were answered, and informed consent                            was obtained. Prior Anticoagulants: The patient has                            taken no previous anticoagulant or antiplatelet                            agents. ASA Grade Assessment: II - A patient with                            mild systemic disease. After reviewing the risks                            and benefits, the patient was deemed in                            satisfactory condition to undergo the procedure.                           After obtaining informed consent, the colonoscope  was passed under direct vision. Throughout the                            procedure, the patient's blood pressure, pulse, and                            oxygen saturations were monitored continuously. The                            Colonoscope was introduced through the anus and                            advanced to the 2 cm into the ileum. The                            colonoscopy was performed without difficulty. The                             patient tolerated the procedure well. The quality                            of the bowel preparation was good. The terminal                            ileum, ileocecal valve, appendiceal orifice, and                            rectum were photographed. Scope In: 8:09:10 AM Scope Out: 8:22:29 AM Scope Withdrawal Time: 0 hours 9 minutes 7 seconds  Total Procedure Duration: 0 hours 13 minutes 19 seconds  Findings:                 Non-bleeding internal hemorrhoids were found during                            retroflexion. The hemorrhoids were small.                           The terminal ileum appeared normal.                           The exam was otherwise without abnormality on                            direct and retroflexion views. Complications:            No immediate complications. Estimated Blood Loss:     Estimated blood loss: none. Impression:               - Non-bleeding internal hemorrhoids.                           - Otherwise normal colonoscopy to TI. Recommendation:           - Patient has a contact number available for  emergencies. The signs and symptoms of potential                            delayed complications were discussed with the                            patient. Return to normal activities tomorrow.                            Written discharge instructions were provided to the                            patient.                           - High-fiber diet. If she has hard stools, start                            Colace 1 tablet p.o. once a day.                           - Use HC Cream 2.5%: Apply externally BID for 10                            days. 2 refills.                           - FU in 12 weeks.                           - If still with problems, would consider further                            treatment and evaluation. Lynann Bolognaajesh Merville Hijazi, MD 04/22/2019 8:27:30 AM This report has been signed electronically.

## 2019-04-22 NOTE — Patient Instructions (Signed)
YOU HAD AN ENDOSCOPIC PROCEDURE TODAY AT [facility]:   Refer to the procedure report that was given to you for any specific questions about what was found during the examination.  If the procedure report does not answer your questions, please call your gastroenterologist to clarify.  If you requested that your care partner not be given the details of your procedure findings, then the procedure report has been included in a sealed envelope for you to review at your convenience later.  YOU SHOULD EXPECT: Some feelings of bloating in the abdomen. Passage of more gas than usual.  Walking can help get rid of the air that was put into your GI tract during the procedure and reduce the bloating. If you had a lower endoscopy (such as a colonoscopy or flexible sigmoidoscopy) you may notice spotting of blood in your stool or on the toilet paper. If you underwent a bowel prep for your procedure, you may not have a normal bowel movement for a few days.  Please Note:  You might notice some irritation and congestion in your nose or some drainage.  This is from the oxygen used during your procedure.  There is no need for concern and it should clear up in a day or so.  SYMPTOMS TO REPORT IMMEDIATELY:   Following lower endoscopy (colonoscopy or flexible sigmoidoscopy):  Excessive amounts of blood in the stool  Significant tenderness or worsening of abdominal pains  Swelling of the abdomen that is new, acute  Fever of 100F or higher   For urgent or emergent issues, a gastroenterologist can be reached at any hour by calling [phone].   DIET:  We do recommend a small meal at first, but then you may proceed to your regular diet.  Drink plenty of fluids but you should avoid alcoholic beverages for 24 hours. Please follow a High Fiber Diet (handout given).  MEDICATIONS: Continue present medications. Use HC Cream 2.5%: Apply externally twice daily for 10 days. 2 Refills.  Follow up with Dr.  Lyndel Safe in 12 weeks to reevaluate.  Please see handouts given to you by your recovery nurse.  ACTIVITY:  You should plan to take it easy for the rest of today and you should NOT DRIVE or use heavy machinery until tomorrow (because of the sedation medicines used during the test).    FOLLOW UP: Our staff will call the number listed on your records 48-72 hours following your procedure to check on you and address any questions or concerns that you may have regarding the information given to you following your procedure. If we do not reach you, we will leave a message.  We will attempt to reach you two times.  During this call, we will ask if you have developed any symptoms of COVID 19. If you develop any symptoms (ie: fever, flu-like symptoms, shortness of breath, cough etc.) before then, please call [phone].  If you test positive for Covid 19 in the 2 weeks post procedure, please call and report this information to [place].    If any biopsies were taken you will be contacted by phone or by letter within the next 1-3 weeks.  Please call us at [phone] if you have not heard about the biopsies in 3 weeks.   Thank you for allowing [place] to provide for your healthcare needs today.   SIGNATURES/CONFIDENTIALITY: You and/or your care partner have signed paperwork which will be entered into your electronic medical record.  These signatures attest to  the fact that that the information above on your After Visit Summary has been reviewed and is understood.  Full responsibility of the confidentiality of this discharge information lies with you and/or your care-partner.

## 2019-04-26 ENCOUNTER — Telehealth: Payer: Self-pay

## 2019-04-26 NOTE — Telephone Encounter (Signed)
  Follow up Call-  Call back number 04/22/2019  Post procedure Call Back phone  # [phone]  Permission to leave phone message Yes  Some recent data might be hidden     Patient questions:  Do you have a fever, pain , or abdominal swelling? No. Pain Score  0 *  Have you tolerated food without any problems? Yes.    Have you been able to return to your normal activities? Yes.    Do you have any questions about your discharge instructions: Diet   No. Medications  No. Follow up visit  No.  Do you have questions or concerns about your Care? No.  Actions: * If pain score is 4 or above: No action needed, pain <4. 1. Have you developed a fever since your procedure? no  2.   Have you had an respiratory symptoms (SOB or cough) since your procedure? no  3.   Have you tested positive for COVID 19 since your procedure no  4.   Have you had any family members/close contacts diagnosed with the COVID 19 since your procedure?  no   If yes to any of these questions please route to Joylene John, RN and Alphonsa Gin, Therapist, sports.

## 2019-06-06 NOTE — Addendum Note (Signed)
Addended by: Alena Bills R on: 06/06/2019 03:08 PM   Modules accepted: Orders

## 2019-09-05 ENCOUNTER — Encounter: Payer: Self-pay | Admitting: Family Medicine

## 2019-09-08 ENCOUNTER — Encounter: Payer: Self-pay | Admitting: Family Medicine

## 2019-09-09 ENCOUNTER — Encounter: Payer: Self-pay | Admitting: Family Medicine

## 2019-09-09 ENCOUNTER — Other Ambulatory Visit: Payer: Self-pay

## 2019-09-09 ENCOUNTER — Ambulatory Visit (INDEPENDENT_AMBULATORY_CARE_PROVIDER_SITE_OTHER): Payer: 59 | Admitting: Family Medicine

## 2019-09-09 DIAGNOSIS — R110 Nausea: Secondary | ICD-10-CM | POA: Diagnosis not present

## 2019-09-09 DIAGNOSIS — R634 Abnormal weight loss: Secondary | ICD-10-CM | POA: Diagnosis not present

## 2019-09-09 MED ORDER — PANTOPRAZOLE SODIUM 40 MG PO TBEC: 40.0000 mg | DELAYED_RELEASE_TABLET | Freq: Every day | ORAL | 1 refills | Status: DC

## 2019-09-09 MED ORDER — ONDANSETRON 4 MG PO TBDP: 4.0000 mg | ORAL_TABLET | Freq: Three times a day (TID) | ORAL | 2 refills | Status: DC | PRN

## 2019-09-09 MED FILL — ONDANSETRON ODT 4 MG TABLET: 4 | 10 days supply | Qty: 30 | Fill #0

## 2019-09-09 MED FILL — PANTOPRAZOLE SOD DR 40 MG T: 40 | 60 days supply | Qty: 60 | Fill #0
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
## 2019-09-16 ENCOUNTER — Ambulatory Visit: Payer: 59 | Admitting: Family Medicine

## 2019-12-09 ENCOUNTER — Encounter: Payer: Self-pay | Admitting: Family Medicine

## 2019-12-09 ENCOUNTER — Other Ambulatory Visit (HOSPITAL_COMMUNITY)
Admission: RE | Admit: 2019-12-09 | Discharge: 2019-12-09 | Disposition: A | Discharge status: Home / Self Care | Payer: 59 | Source: Ambulatory Visit | Attending: Family Medicine | Admitting: Family Medicine

## 2019-12-09 ENCOUNTER — Ambulatory Visit: Payer: 59 | Admitting: Family Medicine

## 2019-12-09 ENCOUNTER — Other Ambulatory Visit: Payer: Self-pay

## 2019-12-09 VITALS — BP 136/82 | HR 124 | Temp 98.0°F | Resp 16 | Ht 65.0 in | Wt 144.0 lb

## 2019-12-09 DIAGNOSIS — Z114 Encounter for screening for human immunodeficiency virus [HIV]: Secondary | ICD-10-CM | POA: Diagnosis not present

## 2019-12-09 DIAGNOSIS — Z113 Encounter for screening for infections with a predominantly sexual mode of transmission: Secondary | ICD-10-CM

## 2019-12-09 DIAGNOSIS — L309 Dermatitis, unspecified: Secondary | ICD-10-CM | POA: Diagnosis not present

## 2019-12-09 MED ORDER — TRIAMCINOLONE ACETONIDE 0.1 % EX CREA: 1.0000 "application " | TOPICAL_CREAM | Freq: Two times a day (BID) | CUTANEOUS | 0 refills | Status: DC

## 2019-12-09 MED FILL — TRIAMCINOLONE 0.1% CREAM: 0.1 | 15 days supply | Qty: 30 | Fill #0
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
## 2019-12-09 NOTE — Patient Instructions (Addendum)
Continue to avoid scented products.  If you do not hear anything about your referral in the next 1-2 weeks, call our office and ask for an update. OK to cancel appt if you do better.  Continue daily emollients.   Give [place] 3-4 business days to get the results of your labs back.   Let us know if you need anything.

## 2019-12-10 LAB — HIV ANTIBODY (ROUTINE TESTING W REFLEX): HIV 1&2 Ab, 4th Generation: NONREACTIVE

## 2019-12-12 LAB — URINE CYTOLOGY ANCILLARY ONLY
Chlamydia: NEGATIVE
Comment: NEGATIVE
Comment: NEGATIVE
Comment: NORMAL
Neisseria Gonorrhea: NEGATIVE
Trichomonas: NEGATIVE

## 2020-01-27 ENCOUNTER — Encounter: Payer: Self-pay | Admitting: Family Medicine

## 2020-01-27 ENCOUNTER — Ambulatory Visit: Payer: 59 | Admitting: Family Medicine

## 2020-01-27 ENCOUNTER — Other Ambulatory Visit: Payer: Self-pay | Admitting: Family Medicine

## 2020-01-27 ENCOUNTER — Other Ambulatory Visit: Payer: Self-pay

## 2020-01-27 VITALS — BP 122/72 | HR 113 | Temp 96.3°F | Ht 65.0 in | Wt 143.1 lb

## 2020-01-27 DIAGNOSIS — R0600 Dyspnea, unspecified: Secondary | ICD-10-CM | POA: Diagnosis not present

## 2020-01-27 DIAGNOSIS — R000 Tachycardia, unspecified: Secondary | ICD-10-CM | POA: Diagnosis not present

## 2020-01-27 DIAGNOSIS — R030 Elevated blood-pressure reading, without diagnosis of hypertension: Secondary | ICD-10-CM

## 2020-01-27 DIAGNOSIS — B370 Candidal stomatitis: Secondary | ICD-10-CM

## 2020-01-27 DIAGNOSIS — M7989 Other specified soft tissue disorders: Secondary | ICD-10-CM

## 2020-01-27 DIAGNOSIS — R0609 Other forms of dyspnea: Secondary | ICD-10-CM

## 2020-01-27 LAB — CBC
HCT: 34.9 % — ABNORMAL LOW (ref 36.0–46.0)
Hemoglobin: 11.3 g/dL — ABNORMAL LOW (ref 12.0–15.0)
MCHC: 32.5 g/dL (ref 30.0–36.0)
MCV: 75.9 fl — ABNORMAL LOW (ref 78.0–100.0)
Platelets: 314 10*3/uL (ref 150.0–400.0)
RBC: 4.59 Mil/uL (ref 3.87–5.11)
RDW: 13.8 % (ref 11.5–15.5)
WBC: 4.3 10*3/uL (ref 4.0–10.5)

## 2020-01-27 LAB — COMPREHENSIVE METABOLIC PANEL
ALT: 20 U/L (ref 0–35)
AST: 18 U/L (ref 0–37)
Albumin: 4 g/dL (ref 3.5–5.2)
Alkaline Phosphatase: 137 U/L — ABNORMAL HIGH (ref 39–117)
BUN: 8 mg/dL (ref 6–23)
CO2: 27 mEq/L (ref 19–32)
Calcium: 10 mg/dL (ref 8.4–10.5)
Chloride: 101 mEq/L (ref 96–112)
Creatinine, Ser: 0.31 mg/dL — ABNORMAL LOW (ref 0.40–1.20)
GFR: 302.28 mL/min (ref >60.00)
Glucose, Bld: 83 mg/dL (ref 70–99)
Potassium: 3.9 mEq/L (ref 3.5–5.1)
Sodium: 137 mEq/L (ref 135–145)
Total Bilirubin: 1 mg/dL (ref 0.2–1.2)
Total Protein: 6.6 g/dL (ref 6.0–8.3)

## 2020-01-27 LAB — T4, FREE: Free T4: 5.55 ng/dL — ABNORMAL HIGH (ref 0.60–1.60)

## 2020-01-27 LAB — TSH: TSH: <0.01 u[IU]/mL — ABNORMAL LOW (ref 0.35–4.50)

## 2020-01-27 MED ORDER — NYSTATIN 100000 UNIT/ML MT SUSP: 5.0000 mL | Freq: Four times a day (QID) | OROMUCOSAL | 0 refills | Status: DC

## 2020-01-27 MED FILL — NYSTATIN 100,000 UNITS/ML S: 100000 | 24 days supply | Qty: 473 | Fill #0

## 2020-01-27 NOTE — Patient Instructions (Signed)
For the swelling in your lower extremities, be sure to elevate your legs when able, mind the salt intake, stay physically active and consider wearing compression stockings.  Give Paula Huff 2-3 business days to get the results of your labs back.   Keep the diet clean and stay active.  Check blood pressures at home/at work. If consistently 140/90 or higher, let me know.  We will be in touch regarding the echo of your heart.  Let Paula Huff know if you need anything.
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
## 2020-01-28 ENCOUNTER — Other Ambulatory Visit: Payer: Self-pay | Admitting: Family Medicine

## 2020-01-28 ENCOUNTER — Telehealth: Payer: Self-pay | Admitting: Family Medicine

## 2020-01-28 DIAGNOSIS — E0590 Thyrotoxicosis, unspecified without thyrotoxic crisis or storm: Secondary | ICD-10-CM

## 2020-01-28 MED ORDER — METHIMAZOLE 10 MG PO TABS: 10.0000 mg | ORAL_TABLET | Freq: Every day | ORAL | 3 refills | Status: DC

## 2020-01-28 NOTE — Telephone Encounter (Signed)
Spoke w pt regarding lab results. Plan in result note. All questions answered.

## 2020-01-31 ENCOUNTER — Other Ambulatory Visit: Payer: Self-pay | Admitting: Family Medicine

## 2020-01-31 DIAGNOSIS — R7401 Elevation of levels of liver transaminase levels: Secondary | ICD-10-CM

## 2020-01-31 MED FILL — methIMAzole 10 MG TABS: 10 | 30 days supply | Qty: 30 | Fill #0

## 2020-02-01 ENCOUNTER — Ambulatory Visit: Payer: 59 | Admitting: Physician Assistant

## 2020-02-02 MED FILL — ONDANSETRON ODT 4MG TBDP: 4 | 10 days supply | Qty: 30 | Fill #1

## 2020-02-10 ENCOUNTER — Other Ambulatory Visit (HOSPITAL_BASED_OUTPATIENT_CLINIC_OR_DEPARTMENT_OTHER): Payer: 59

## 2020-02-15 ENCOUNTER — Ambulatory Visit: Payer: 59 | Admitting: Internal Medicine

## 2020-02-15 ENCOUNTER — Encounter: Payer: Self-pay | Admitting: Internal Medicine

## 2020-02-15 ENCOUNTER — Other Ambulatory Visit: Payer: Self-pay

## 2020-02-15 VITALS — BP 120/60 | HR 112 | Ht 65.0 in | Wt 146.4 lb

## 2020-02-15 DIAGNOSIS — R7401 Elevation of levels of liver transaminase levels: Secondary | ICD-10-CM

## 2020-02-15 DIAGNOSIS — R748 Abnormal levels of other serum enzymes: Secondary | ICD-10-CM

## 2020-02-15 DIAGNOSIS — E0590 Thyrotoxicosis, unspecified without thyrotoxic crisis or storm: Secondary | ICD-10-CM | POA: Diagnosis not present

## 2020-02-15 MED ORDER — ATENOLOL 50 MG PO TABS: 50.0000 mg | ORAL_TABLET | Freq: Every day | ORAL | 1 refills | Status: DC

## 2020-02-15 MED FILL — ATENOLOL 50 MG TABLET: 50 | 90 days supply | Qty: 90 | Fill #0

## 2020-02-15 NOTE — Patient Instructions (Signed)
We recommend that you follow these hyperthyroidism instructions at home:  1) Take Methimazole 10 mg one time a day  If you develop severe sore throat with high fevers OR develop unexplained yellowing of your skin, eyes, under your tongue, severe abdominal pain with nausea or vomiting --> then please get evaluated immediately.  2) Atenolol 50 mg one time a day 3) Get repeat thyroid labs 6 weeks .   It is ESSENTIAL to get follow-up labs to help avoid over or undertreatment of your hyperthyroidism - both of which can be dangerous to your health.
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
## 2020-02-16 ENCOUNTER — Encounter: Payer: Self-pay | Admitting: Internal Medicine

## 2020-02-16 LAB — CBC WITH DIFFERENTIAL/PLATELET
Basophils Absolute: 0 10*3/uL (ref 0.0–0.1)
Basophils Relative: 0.8 % (ref 0.0–3.0)
Eosinophils Absolute: 0.1 10*3/uL (ref 0.0–0.7)
Eosinophils Relative: 1.4 % (ref 0.0–5.0)
HCT: 33.6 % — ABNORMAL LOW (ref 36.0–46.0)
Hemoglobin: 11 g/dL — ABNORMAL LOW (ref 12.0–15.0)
Lymphocytes Relative: 27.1 % (ref 12.0–46.0)
Lymphs Abs: 1.2 10*3/uL (ref 0.7–4.0)
MCHC: 32.8 g/dL (ref 30.0–36.0)
MCV: 76.2 fl — ABNORMAL LOW (ref 78.0–100.0)
Monocytes Absolute: 0.7 10*3/uL (ref 0.1–1.0)
Monocytes Relative: 15 % — ABNORMAL HIGH (ref 3.0–12.0)
Neutro Abs: 2.5 10*3/uL (ref 1.4–7.7)
Neutrophils Relative %: 55.7 % (ref 43.0–77.0)
Platelets: 311 10*3/uL (ref 150.0–400.0)
RBC: 4.4 Mil/uL (ref 3.87–5.11)
RDW: 14.6 % (ref 11.5–15.5)
WBC: 4.5 10*3/uL (ref 4.0–10.5)

## 2020-02-16 LAB — COMPREHENSIVE METABOLIC PANEL
ALT: 17 U/L (ref 0–35)
AST: 16 U/L (ref 0–37)
Albumin: 4 g/dL (ref 3.5–5.2)
Alkaline Phosphatase: 145 U/L — ABNORMAL HIGH (ref 39–117)
BUN: 10 mg/dL (ref 6–23)
CO2: 28 mEq/L (ref 19–32)
Calcium: 9.4 mg/dL (ref 8.4–10.5)
Chloride: 104 mEq/L (ref 96–112)
Creatinine, Ser: 0.41 mg/dL (ref 0.40–1.20)
GFR: 218.84 mL/min (ref >60.00)
Glucose, Bld: 111 mg/dL — ABNORMAL HIGH (ref 70–99)
Potassium: 3.9 mEq/L (ref 3.5–5.1)
Sodium: 138 mEq/L (ref 135–145)
Total Bilirubin: 0.5 mg/dL (ref 0.2–1.2)
Total Protein: 6.6 g/dL (ref 6.0–8.3)

## 2020-02-16 LAB — HEPATIC FUNCTION PANEL
ALT: 17 U/L (ref 0–35)
AST: 16 U/L (ref 0–37)
Albumin: 4 g/dL (ref 3.5–5.2)
Alkaline Phosphatase: 145 U/L — ABNORMAL HIGH (ref 39–117)
Bilirubin, Direct: 0.2 mg/dL (ref 0.0–0.3)
Total Bilirubin: 0.5 mg/dL (ref 0.2–1.2)
Total Protein: 6.6 g/dL (ref 6.0–8.3)

## 2020-02-16 LAB — TSH: TSH: <0.01 u[IU]/mL — ABNORMAL LOW (ref 0.35–4.50)

## 2020-02-16 LAB — T4, FREE: Free T4: 4.18 ng/dL — ABNORMAL HIGH (ref 0.60–1.60)

## 2020-02-17 ENCOUNTER — Other Ambulatory Visit: Payer: Self-pay | Admitting: Family Medicine

## 2020-02-17 DIAGNOSIS — Z113 Encounter for screening for infections with a predominantly sexual mode of transmission: Secondary | ICD-10-CM

## 2020-02-17 LAB — TRAB (TSH RECEPTOR BINDING ANTIBODY): TRAB: 21.65 IU/L — ABNORMAL HIGH (ref <=2.00)

## 2020-02-17 NOTE — Progress Notes (Signed)
RP

## 2020-02-21 NOTE — Addendum Note (Signed)
Addended byConrad Meb[place] D on: 02/21/2020 01:56 PM   Modules accepted: Orders

## 2020-02-24 ENCOUNTER — Ambulatory Visit (HOSPITAL_BASED_OUTPATIENT_CLINIC_OR_DEPARTMENT_OTHER)
Admission: RE | Admit: 2020-02-24 | Discharge: 2020-02-24 | Disposition: A | Discharge status: Home / Self Care | Payer: 59 | Source: Ambulatory Visit | Attending: Family Medicine | Admitting: Family Medicine

## 2020-02-24 ENCOUNTER — Other Ambulatory Visit: Payer: Self-pay

## 2020-02-24 DIAGNOSIS — R0609 Other forms of dyspnea: Secondary | ICD-10-CM

## 2020-02-24 DIAGNOSIS — R0600 Dyspnea, unspecified: Secondary | ICD-10-CM | POA: Insufficient documentation

## 2020-02-24 DIAGNOSIS — M7989 Other specified soft tissue disorders: Secondary | ICD-10-CM | POA: Diagnosis not present

## 2020-02-24 MED FILL — ATENOLOL 50 MG TABLET: 50 | 90 days supply | Qty: 90 | Fill #0

## 2020-03-06 ENCOUNTER — Other Ambulatory Visit: Payer: Self-pay

## 2020-03-06 ENCOUNTER — Ambulatory Visit (INDEPENDENT_AMBULATORY_CARE_PROVIDER_SITE_OTHER): Payer: 59 | Admitting: Family Medicine

## 2020-03-06 ENCOUNTER — Encounter: Payer: Self-pay | Admitting: Family Medicine

## 2020-03-06 VITALS — BP 108/62 | HR 96 | Temp 98.5°F | Ht 65.0 in | Wt 148.2 lb

## 2020-03-06 DIAGNOSIS — Z0000 Encounter for general adult medical examination without abnormal findings: Secondary | ICD-10-CM

## 2020-03-06 DIAGNOSIS — Z1159 Encounter for screening for other viral diseases: Secondary | ICD-10-CM | POA: Diagnosis not present

## 2020-03-06 LAB — LIPID PANEL
Cholesterol: 79 mg/dL (ref 0–200)
HDL: 42.9 mg/dL (ref >39.00)
LDL Cholesterol: 28 mg/dL (ref 0–99)
NonHDL: 35.95
Total CHOL/HDL Ratio: 2
Triglycerides: 39 mg/dL (ref 0.0–149.0)
VLDL: 7.8 mg/dL (ref 0.0–40.0)

## 2020-03-06 LAB — COMPREHENSIVE METABOLIC PANEL
ALT: 13 U/L (ref 0–35)
AST: 13 U/L (ref 0–37)
Albumin: 3.8 g/dL (ref 3.5–5.2)
Alkaline Phosphatase: 158 U/L — ABNORMAL HIGH (ref 39–117)
BUN: 12 mg/dL (ref 6–23)
CO2: 24 mEq/L (ref 19–32)
Calcium: 9.6 mg/dL (ref 8.4–10.5)
Chloride: 107 mEq/L (ref 96–112)
Creatinine, Ser: 0.33 mg/dL — ABNORMAL LOW (ref 0.40–1.20)
GFR: 281.04 mL/min (ref >60.00)
Glucose, Bld: 109 mg/dL — ABNORMAL HIGH (ref 70–99)
Potassium: 4.1 mEq/L (ref 3.5–5.1)
Sodium: 140 mEq/L (ref 135–145)
Total Bilirubin: 0.4 mg/dL (ref 0.2–1.2)
Total Protein: 6.1 g/dL (ref 6.0–8.3)

## 2020-03-06 LAB — CBC
HCT: 32 % — ABNORMAL LOW (ref 36.0–46.0)
Hemoglobin: 10.7 g/dL — ABNORMAL LOW (ref 12.0–15.0)
MCHC: 33.3 g/dL (ref 30.0–36.0)
MCV: 75.9 fl — ABNORMAL LOW (ref 78.0–100.0)
Platelets: 298 10*3/uL (ref 150.0–400.0)
RBC: 4.22 Mil/uL (ref 3.87–5.11)
RDW: 15.3 % (ref 11.5–15.5)
WBC: 4.5 10*3/uL (ref 4.0–10.5)

## 2020-03-06 NOTE — Patient Instructions (Addendum)
Call Center for [facility] at [phone] for an appointment.  They are located at [address], [address], [place], [place], [address] (right across the hall from our office).  Give [place] 2-3 business days to get the results of your labs back.   Keep the diet clean and stay active.  Artificial tears like Refresh and Systane may be used for comfort. OK to get generic version. Generally people use them every 2-4 hours, but you can use them as much as you want because there is no medication in it.  Let us know if you need anything.
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
## 2020-03-07 ENCOUNTER — Other Ambulatory Visit (INDEPENDENT_AMBULATORY_CARE_PROVIDER_SITE_OTHER): Payer: 59

## 2020-03-07 DIAGNOSIS — D649 Anemia, unspecified: Secondary | ICD-10-CM

## 2020-03-07 LAB — HEPATITIS C ANTIBODY
Hepatitis C Ab: NONREACTIVE
SIGNAL TO CUT-OFF: 0.03 (ref <1.00)

## 2020-03-07 LAB — RPR: RPR Ser Ql: NONREACTIVE

## 2020-03-07 LAB — IBC + FERRITIN
Ferritin: 118.4 ng/mL (ref 10.0–291.0)
Iron: 43 ug/dL (ref 42–145)
Saturation Ratios: 12.9 % — ABNORMAL LOW (ref 20.0–50.0)
Transferrin: 238 mg/dL (ref 212.0–360.0)

## 2020-03-16 ENCOUNTER — Other Ambulatory Visit: Payer: Self-pay

## 2020-03-16 ENCOUNTER — Other Ambulatory Visit (INDEPENDENT_AMBULATORY_CARE_PROVIDER_SITE_OTHER): Payer: 59

## 2020-03-16 DIAGNOSIS — R748 Abnormal levels of other serum enzymes: Secondary | ICD-10-CM

## 2020-03-16 DIAGNOSIS — R7401 Elevation of levels of liver transaminase levels: Secondary | ICD-10-CM | POA: Diagnosis not present

## 2020-03-16 LAB — GAMMA GT: GGT: 32 U/L (ref 7–51)

## 2020-03-20 LAB — ALKALINE PHOSPHATASE, ISOENZYMES
Alkaline Phosphatase: 186 IU/L — ABNORMAL HIGH (ref 48–121)
BONE FRACTION: 49 % (ref 14–68)
INTESTINAL FRAC.: 9 % (ref 0–18)
LIVER FRACTION: 42 % (ref 18–85)

## 2020-03-22 ENCOUNTER — Ambulatory Visit: Payer: 59 | Admitting: Physician Assistant

## 2020-03-29 ENCOUNTER — Encounter (HOSPITAL_BASED_OUTPATIENT_CLINIC_OR_DEPARTMENT_OTHER): Payer: Self-pay | Admitting: Emergency Medicine

## 2020-03-29 ENCOUNTER — Other Ambulatory Visit: Payer: Self-pay

## 2020-03-29 ENCOUNTER — Emergency Department (HOSPITAL_BASED_OUTPATIENT_CLINIC_OR_DEPARTMENT_OTHER)
Admission: EM | Admit: 2020-03-29 | Discharge: 2020-03-29 | Disposition: A | Discharge status: Home / Self Care | Payer: 59 | Attending: Emergency Medicine | Admitting: Emergency Medicine

## 2020-03-29 DIAGNOSIS — R21 Rash and other nonspecific skin eruption: Secondary | ICD-10-CM | POA: Diagnosis present

## 2020-03-29 DIAGNOSIS — E039 Hypothyroidism, unspecified: Secondary | ICD-10-CM | POA: Diagnosis not present

## 2020-03-29 DIAGNOSIS — L509 Urticaria, unspecified: Secondary | ICD-10-CM | POA: Insufficient documentation

## 2020-03-29 HISTORY — DX: Disorder of thyroid, unspecified: E07.9

## 2020-03-29 MED ORDER — PREDNISONE 20 MG PO TABS
40.0000 mg | ORAL_TABLET | Freq: Every day | ORAL | 0 refills | Status: AC
Start: 2020-03-29 — End: 2020-04-03

## 2020-03-29 NOTE — ED Triage Notes (Signed)
Intermittent hives since Sunday. Taking benadryl without relief.
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
## 2020-03-30 MED FILL — predniSONE 20 MG TABS: 20 | 5 days supply | Qty: 10 | Fill #0

## 2020-03-30 MED FILL — methIMAzole 10 MG TABS: 10 | 30 days supply | Qty: 30 | Fill #1

## 2020-05-21 ENCOUNTER — Encounter: Payer: Self-pay | Admitting: Internal Medicine

## 2020-08-11 IMAGING — US TRANSVAGINAL ULTRASOUND OF PELVIS
1 series · 13 of 25 positions shown · non-contrast
Comparison: None available.

CLINICAL DATA: Initial evaluation for menorrhagia with regular
cycle. History of tubal ligation.

EXAM:
ULTRASOUND PELVIS TRANSVAGINAL
TECHNIQUE: Transvaginal ultrasound examination of the pelvis was performed
including evaluation of the uterus, ovaries, adnexal regions, and
pelvic cul-de-sac.

[Series 1: transvaginal ultrasound of pelvis · 0.08mm/px · 13 of 90 slices shown]
[im 1/90]
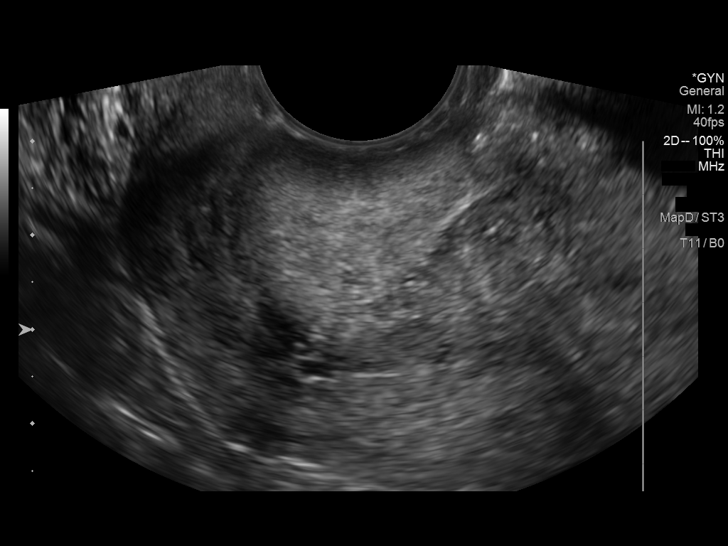
[im 8/90]
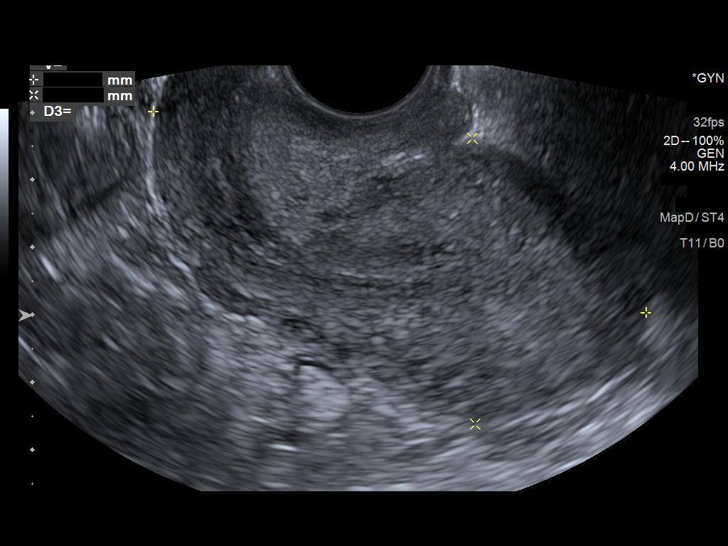
[im 15/90]
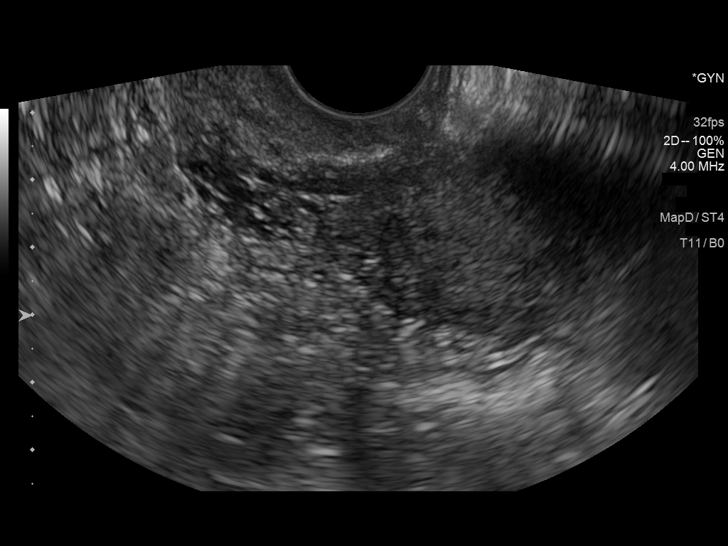
[im 23/90]
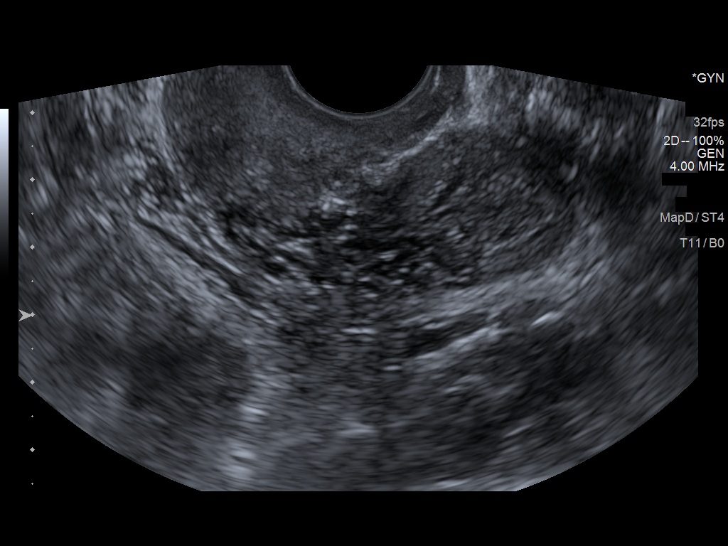
[im 30/90]
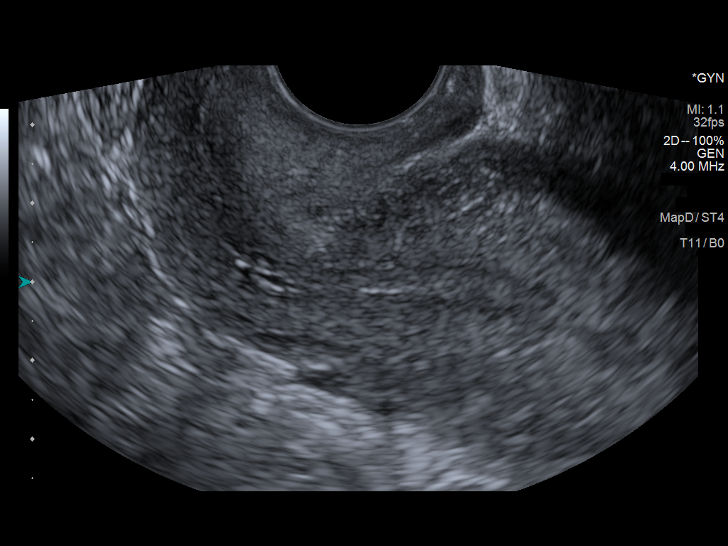
[im 38/90]
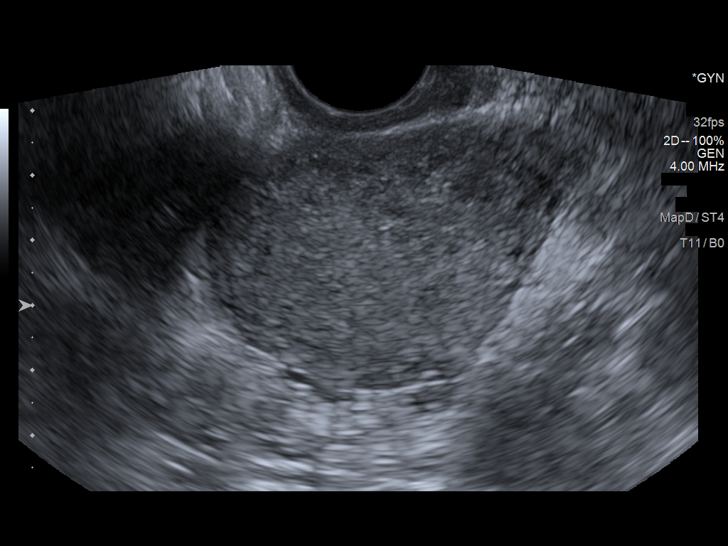
[im 45/90]
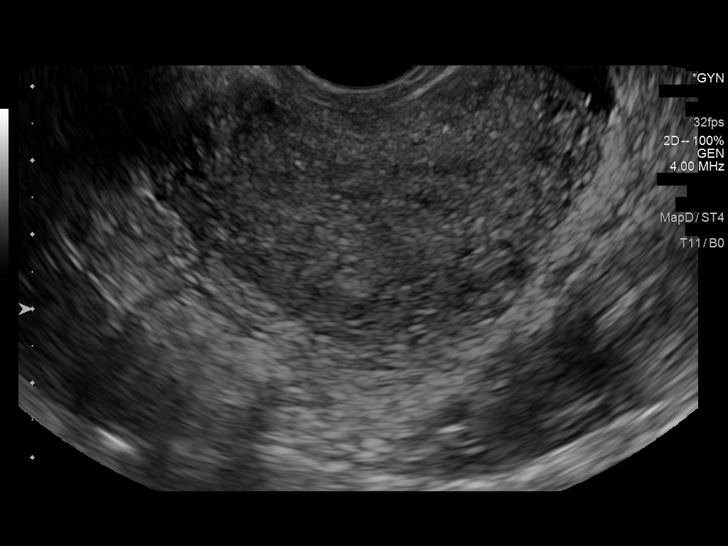
[im 52/90]
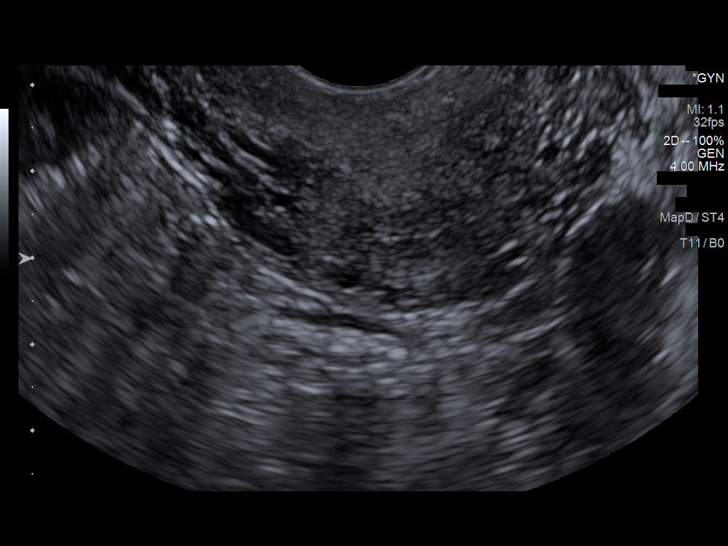
[im 60/90]
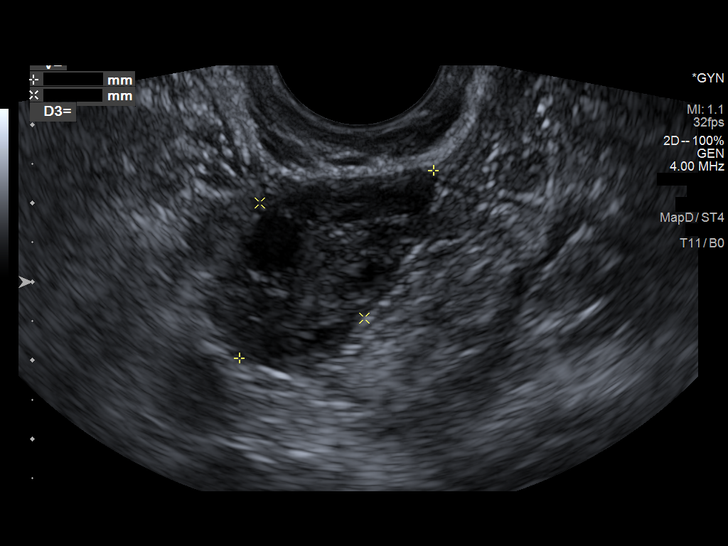
[im 67/90]
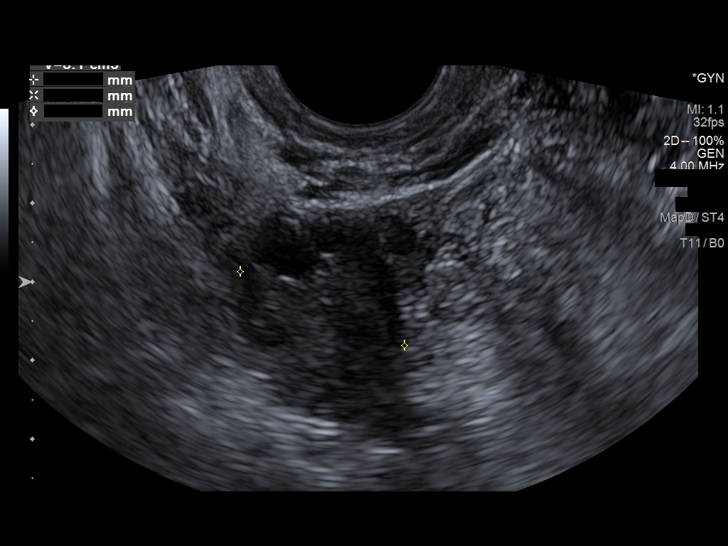
[im 75/90]
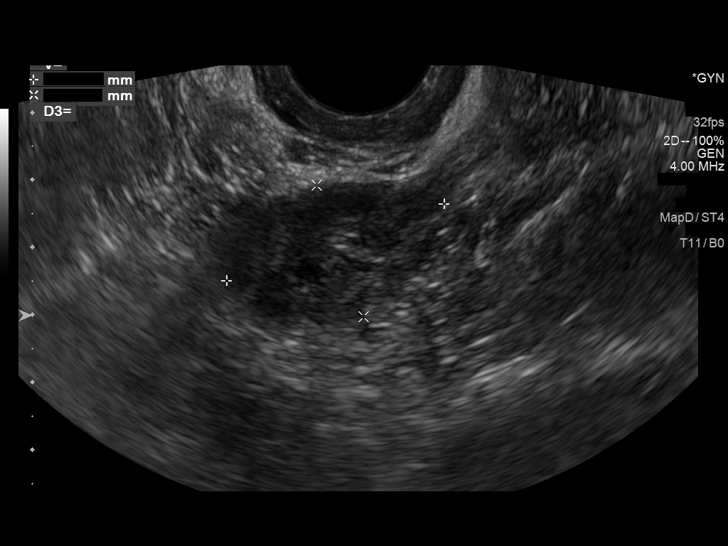
[im 82/90]
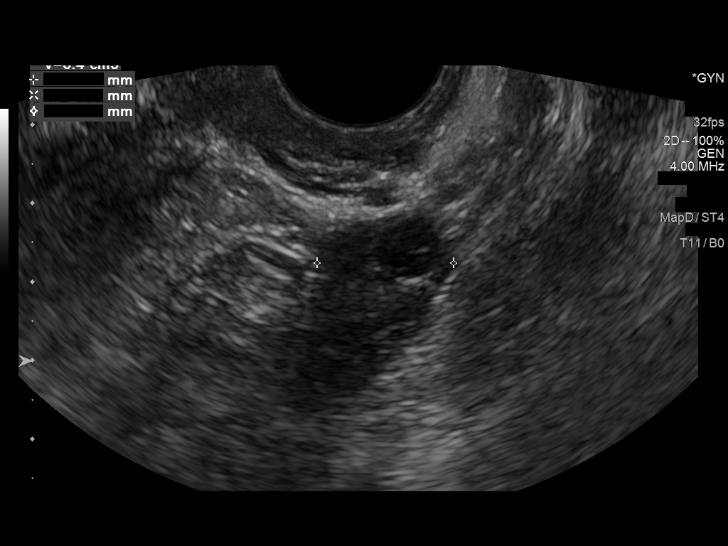
[im 90/90]
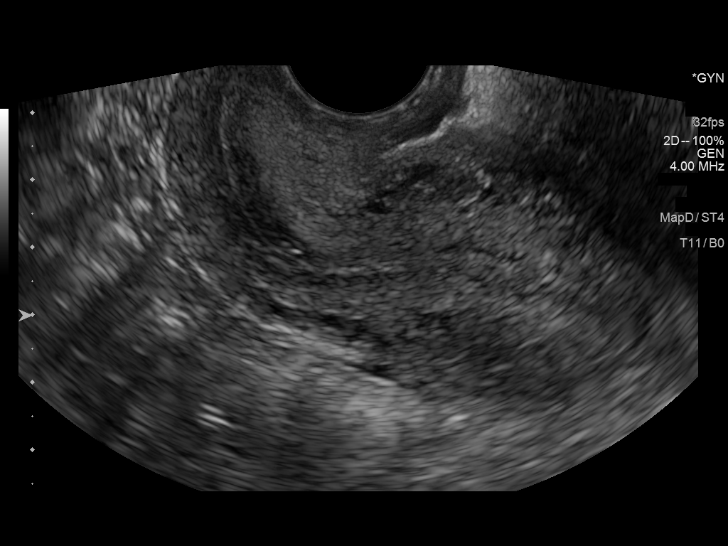

[13 of 25 positions shown; findings below may reference images not displayed]

FINDINGS: Uterus

Measurements: 7.9 x 4.2 x 5.6 cm = volume: 99 mL. Uterus is
retroflexed. No fibroids or other mass visualized.

Endometrium

Thickness: 5.4 mm.  No focal abnormality visualized.

Right ovary

Measurements: 3.4 x 2.0 x 2.3 cm = volume: 8.0 mL. Normal
appearance/no adnexal mass.

Left ovary

Measurements: 3.4 x 2.1 x 1.7 cm = volume: 6.0 mL. Normal
appearance/no adnexal mass.

Other findings:  No abnormal free fluid
IMPRESSION: 1. Endometrial stripe within normal limits measuring 5.4 mm in
thickness. If bleeding remains unresponsive to hormonal or medical
therapy, sonohysterogram should be considered for focal lesion
work-up. (Ref: Radiological Reasoning: Algorithmic Workup of
Abnormal Vaginal Bleeding with Endovaginal Sonography and
Sonohysterography. AJR 1663; 191:S68-73).
2. Otherwise unremarkable and normal pelvic ultrasound.

## 2020-08-30 ENCOUNTER — Other Ambulatory Visit: Payer: Self-pay

## 2020-08-30 ENCOUNTER — Encounter (HOSPITAL_BASED_OUTPATIENT_CLINIC_OR_DEPARTMENT_OTHER): Payer: Self-pay

## 2020-08-30 ENCOUNTER — Emergency Department (HOSPITAL_BASED_OUTPATIENT_CLINIC_OR_DEPARTMENT_OTHER)
Admission: EM | Admit: 2020-08-30 | Discharge: 2020-08-30 | Disposition: A | Discharge status: Home / Self Care | Payer: PRIVATE HEALTH INSURANCE | Attending: Emergency Medicine | Admitting: Emergency Medicine

## 2020-08-30 ENCOUNTER — Emergency Department (HOSPITAL_BASED_OUTPATIENT_CLINIC_OR_DEPARTMENT_OTHER): Payer: PRIVATE HEALTH INSURANCE

## 2020-08-30 DIAGNOSIS — R059 Cough, unspecified: Secondary | ICD-10-CM | POA: Diagnosis present

## 2020-08-30 DIAGNOSIS — Z20822 Contact with and (suspected) exposure to covid-19: Secondary | ICD-10-CM

## 2020-08-30 DIAGNOSIS — R197 Diarrhea, unspecified: Secondary | ICD-10-CM

## 2020-08-30 DIAGNOSIS — Z79899 Other long term (current) drug therapy: Secondary | ICD-10-CM | POA: Diagnosis not present

## 2020-08-30 DIAGNOSIS — U071 COVID-19: Secondary | ICD-10-CM | POA: Diagnosis not present

## 2020-08-30 LAB — URINALYSIS, ROUTINE W REFLEX MICROSCOPIC
Bilirubin Urine: NEGATIVE
Glucose, UA: NEGATIVE mg/dL
Ketones, ur: NEGATIVE mg/dL
Leukocytes,Ua: NEGATIVE
Nitrite: NEGATIVE
Protein, ur: NEGATIVE mg/dL
Specific Gravity, Urine: 1.015 (ref 1.005–1.030)
pH: 6 (ref 5.0–8.0)

## 2020-08-30 LAB — COMPREHENSIVE METABOLIC PANEL
ALT: 23 U/L (ref 0–44)
AST: 26 U/L (ref 15–41)
Albumin: 3.9 g/dL (ref 3.5–5.0)
Alkaline Phosphatase: 135 U/L — ABNORMAL HIGH (ref 38–126)
Anion gap: 13 (ref 5–15)
BUN: 7 mg/dL (ref 6–20)
CO2: 20 mmol/L — ABNORMAL LOW (ref 22–32)
Calcium: 9.4 mg/dL (ref 8.9–10.3)
Chloride: 101 mmol/L (ref 98–111)
Creatinine, Ser: 0.36 mg/dL — ABNORMAL LOW (ref 0.44–1.00)
GFR, Estimated: >60 mL/min (ref >60)
Glucose, Bld: 95 mg/dL (ref 70–99)
Potassium: 3.7 mmol/L (ref 3.5–5.1)
Sodium: 134 mmol/L — ABNORMAL LOW (ref 135–145)
Total Bilirubin: 0.7 mg/dL (ref 0.3–1.2)
Total Protein: 7.5 g/dL (ref 6.5–8.1)

## 2020-08-30 LAB — CBC WITH DIFFERENTIAL/PLATELET
Abs Immature Granulocytes: 0 10*3/uL (ref 0.00–0.07)
Basophils Absolute: 0 10*3/uL (ref 0.0–0.1)
Basophils Relative: 0 %
Eosinophils Absolute: 0 10*3/uL (ref 0.0–0.5)
Eosinophils Relative: 0 %
HCT: 41.1 % (ref 36.0–46.0)
Hemoglobin: 13.6 g/dL (ref 12.0–15.0)
Lymphocytes Relative: 50 %
Lymphs Abs: 1.2 10*3/uL (ref 0.7–4.0)
MCH: 24.8 pg — ABNORMAL LOW (ref 26.0–34.0)
MCHC: 33.1 g/dL (ref 30.0–36.0)
MCV: 75 fL — ABNORMAL LOW (ref 80.0–100.0)
Monocytes Absolute: 0.6 10*3/uL (ref 0.1–1.0)
Monocytes Relative: 26 %
Neutro Abs: 0.6 10*3/uL — ABNORMAL LOW (ref 1.7–7.7)
Neutrophils Relative %: 24 %
Platelets: 288 10*3/uL (ref 150–400)
RBC: 5.48 MIL/uL — ABNORMAL HIGH (ref 3.87–5.11)
RDW: 14.2 % (ref 11.5–15.5)
WBC: 2.4 10*3/uL — ABNORMAL LOW (ref 4.0–10.5)
nRBC: 0 % (ref 0.0–0.2)

## 2020-08-30 LAB — LIPASE, BLOOD: Lipase: 29 U/L (ref 11–51)

## 2020-08-30 LAB — URINALYSIS, MICROSCOPIC (REFLEX)

## 2020-08-30 LAB — SARS CORONAVIRUS 2 (TAT 6-24 HRS): SARS Coronavirus 2: POSITIVE — AB

## 2020-08-30 LAB — PREGNANCY, URINE: Preg Test, Ur: NEGATIVE

## 2020-08-30 MED ORDER — ONDANSETRON 4 MG PO TBDP
4.0000 mg | ORAL_TABLET | Freq: Three times a day (TID) | ORAL | 0 refills | Status: DC | PRN
Start: 2020-08-30 — End: 2021-03-29

## 2020-08-30 MED ORDER — SODIUM CHLORIDE 0.9 % IV BOLUS
1000.0000 mL | Freq: Once | INTRAVENOUS | Status: AC
  Administered 2020-08-30: 1000 mL via INTRAVENOUS

## 2020-08-30 MED ORDER — NAPROXEN 500 MG PO TABS
500.0000 mg | ORAL_TABLET | Freq: Two times a day (BID) | ORAL | 0 refills | Status: DC
Start: 2020-08-30 — End: 2021-03-29

## 2020-08-30 MED ORDER — ONDANSETRON HCL 4 MG/2ML IJ SOLN
4.0000 mg | Freq: Once | INTRAMUSCULAR | Status: AC
  Administered 2020-08-30: 4 mg via INTRAVENOUS
  Filled 2020-08-30: qty 2

## 2020-08-30 NOTE — Discharge Instructions (Addendum)
Take the medications as prescribed  Return for new or worsening symptoms 

## 2020-08-30 NOTE — ED Notes (Signed)
PO fluid challenge initiated 
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
## 2020-08-30 NOTE — ED Triage Notes (Signed)
Pt reports cough, congestion, body aches, Chills, fever, headache, runny nose, sore throat, and n/d x 3 days

## 2020-08-30 NOTE — ED Notes (Signed)
Pt. Is on 12 lead monitor and vitals are cycling

## 2020-08-31 NOTE — ED Provider Notes (Signed)
Attempted to notify patient of positive Covid results.  Unfortunately no answer on phone and unable to leave voicemail.   Linwood Dibbles, PA-C 08/31/20 1048    Tilden Fossa, MD 08/31/20 1049

## 2020-09-03 LAB — PATHOLOGIST SMEAR REVIEW: Path Review: REACTIVE

## 2020-09-06 MED FILL — methIMAzole 10 MG TABS: 10 | 30 days supply | Qty: 30 | Fill #2

## 2020-11-30 ENCOUNTER — Encounter (HOSPITAL_BASED_OUTPATIENT_CLINIC_OR_DEPARTMENT_OTHER): Payer: Self-pay | Admitting: *Deleted

## 2020-11-30 ENCOUNTER — Emergency Department (HOSPITAL_BASED_OUTPATIENT_CLINIC_OR_DEPARTMENT_OTHER)
Admission: EM | Admit: 2020-11-30 | Discharge: 2020-11-30 | Disposition: A | Discharge status: Home / Self Care | Payer: PRIVATE HEALTH INSURANCE | Attending: Emergency Medicine | Admitting: Emergency Medicine

## 2020-11-30 ENCOUNTER — Other Ambulatory Visit: Payer: Self-pay

## 2020-11-30 DIAGNOSIS — N8110 Cystocele, unspecified: Secondary | ICD-10-CM | POA: Insufficient documentation

## 2020-11-30 DIAGNOSIS — N819 Female genital prolapse, unspecified: Secondary | ICD-10-CM | POA: Diagnosis present

## 2020-11-30 DIAGNOSIS — R000 Tachycardia, unspecified: Secondary | ICD-10-CM | POA: Insufficient documentation

## 2020-11-30 MED ORDER — ATENOLOL 25 MG PO TABS
50.0000 mg | ORAL_TABLET | Freq: Once | ORAL | Status: AC
  Administered 2020-11-30: 50 mg via ORAL
  Filled 2020-11-30 (×2): qty 2

## 2020-11-30 NOTE — ED Triage Notes (Addendum)
She feels she has a prolapsed bladder. Pain at times. Heart rate 128 noted. Pt states she has not been taking her Atenolol.

## 2020-11-30 NOTE — Discharge Instructions (Signed)
Follow-up with an OB/GYN doctor to discuss treatment options for the cystocele.
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
## 2021-01-07 ENCOUNTER — Other Ambulatory Visit (HOSPITAL_BASED_OUTPATIENT_CLINIC_OR_DEPARTMENT_OTHER): Payer: Self-pay

## 2021-01-07 ENCOUNTER — Other Ambulatory Visit: Payer: Self-pay

## 2021-01-07 ENCOUNTER — Emergency Department (HOSPITAL_BASED_OUTPATIENT_CLINIC_OR_DEPARTMENT_OTHER)
Admission: EM | Admit: 2021-01-07 | Discharge: 2021-01-07 | Disposition: A | Discharge status: Home / Self Care | Payer: Managed Care, Other (non HMO) | Attending: Emergency Medicine | Admitting: Emergency Medicine

## 2021-01-07 DIAGNOSIS — R197 Diarrhea, unspecified: Secondary | ICD-10-CM | POA: Insufficient documentation

## 2021-01-07 DIAGNOSIS — R112 Nausea with vomiting, unspecified: Secondary | ICD-10-CM | POA: Insufficient documentation

## 2021-01-07 DIAGNOSIS — R509 Fever, unspecified: Secondary | ICD-10-CM | POA: Insufficient documentation

## 2021-01-07 LAB — CBC WITH DIFFERENTIAL/PLATELET
Abs Immature Granulocytes: 0.01 10*3/uL (ref 0.00–0.07)
Basophils Absolute: 0 10*3/uL (ref 0.0–0.1)
Basophils Relative: 0 %
Eosinophils Absolute: 0 10*3/uL (ref 0.0–0.5)
Eosinophils Relative: 0 %
HCT: 39.7 % (ref 36.0–46.0)
Hemoglobin: 13.1 g/dL (ref 12.0–15.0)
Immature Granulocytes: 0 %
Lymphocytes Relative: 21 %
Lymphs Abs: 0.9 10*3/uL (ref 0.7–4.0)
MCH: 25.1 pg — ABNORMAL LOW (ref 26.0–34.0)
MCHC: 33 g/dL (ref 30.0–36.0)
MCV: 76.1 fL — ABNORMAL LOW (ref 80.0–100.0)
Monocytes Absolute: 0.5 10*3/uL (ref 0.1–1.0)
Monocytes Relative: 11 %
Neutro Abs: 3 10*3/uL (ref 1.7–7.7)
Neutrophils Relative %: 68 %
Platelets: 302 10*3/uL (ref 150–400)
RBC: 5.22 MIL/uL — ABNORMAL HIGH (ref 3.87–5.11)
RDW: 14.4 % (ref 11.5–15.5)
WBC: 4.5 10*3/uL (ref 4.0–10.5)
nRBC: 0 % (ref 0.0–0.2)

## 2021-01-07 LAB — COMPREHENSIVE METABOLIC PANEL
ALT: 20 U/L (ref 0–44)
AST: 17 U/L (ref 15–41)
Albumin: 3.7 g/dL (ref 3.5–5.0)
Alkaline Phosphatase: 159 U/L — ABNORMAL HIGH (ref 38–126)
Anion gap: 10 (ref 5–15)
BUN: 7 mg/dL (ref 6–20)
CO2: 23 mmol/L (ref 22–32)
Calcium: 9.5 mg/dL (ref 8.9–10.3)
Chloride: 103 mmol/L (ref 98–111)
Creatinine, Ser: 0.36 mg/dL — ABNORMAL LOW (ref 0.44–1.00)
GFR, Estimated: >60 mL/min (ref >60)
Glucose, Bld: 106 mg/dL — ABNORMAL HIGH (ref 70–99)
Potassium: 3.5 mmol/L (ref 3.5–5.1)
Sodium: 136 mmol/L (ref 135–145)
Total Bilirubin: 1.2 mg/dL (ref 0.3–1.2)
Total Protein: 7.1 g/dL (ref 6.5–8.1)

## 2021-01-07 LAB — PREGNANCY, URINE: Preg Test, Ur: NEGATIVE

## 2021-01-07 LAB — LIPASE, BLOOD: Lipase: 29 U/L (ref 11–51)

## 2021-01-07 MED ORDER — MORPHINE SULFATE (PF) 2 MG/ML IV SOLN
2.0000 mg | Freq: Once | INTRAVENOUS | Status: AC
  Administered 2021-01-07: 2 mg via INTRAVENOUS
  Filled 2021-01-07: qty 1

## 2021-01-07 MED ORDER — ONDANSETRON 4 MG PO TBDP
ORAL_TABLET | ORAL | 0 refills | Status: DC
  Filled 2021-01-07: qty 20, 4d supply, fill #0

## 2021-01-07 MED ORDER — ONDANSETRON HCL 4 MG/2ML IJ SOLN
4.0000 mg | Freq: Once | INTRAMUSCULAR | Status: AC
  Administered 2021-01-07: 4 mg via INTRAVENOUS
  Filled 2021-01-07: qty 2

## 2021-01-07 MED ORDER — SODIUM CHLORIDE 0.9 % IV BOLUS
1000.0000 mL | Freq: Once | INTRAVENOUS | Status: AC
  Administered 2021-01-07: 1000 mL via INTRAVENOUS

## 2021-01-07 MED ORDER — ALUM & MAG HYDROXIDE-SIMETH 200-200-20 MG/5ML PO SUSP
30.0000 mL | Freq: Once | ORAL | Status: AC
  Administered 2021-01-07: 30 mL via ORAL
  Filled 2021-01-07: qty 30

## 2021-01-07 NOTE — ED Triage Notes (Signed)
Pt c/o N/VD, headache, chills - last 12 hours PMHx  Graves disease
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
## 2021-01-07 NOTE — Discharge Instructions (Signed)
Take the nausea medicine for nausea he can take Imodium for diarrhea.  Please return for worsening abdominal pain or inability to eat or drink.

## 2021-01-08 ENCOUNTER — Other Ambulatory Visit (HOSPITAL_BASED_OUTPATIENT_CLINIC_OR_DEPARTMENT_OTHER): Payer: Self-pay

## 2021-02-25 ENCOUNTER — Telehealth: Payer: Self-pay | Admitting: *Deleted

## 2021-02-25 NOTE — Telephone Encounter (Signed)
Returned call from 11:23 AM. Mailbox is full to leave a message for patient to call and schedule annual for 03/28/2021.

## 2021-03-29 ENCOUNTER — Other Ambulatory Visit: Payer: Self-pay

## 2021-03-29 ENCOUNTER — Encounter: Payer: Self-pay | Admitting: Family Medicine

## 2021-03-29 ENCOUNTER — Ambulatory Visit (INDEPENDENT_AMBULATORY_CARE_PROVIDER_SITE_OTHER): Payer: Managed Care, Other (non HMO) | Admitting: Family Medicine

## 2021-03-29 ENCOUNTER — Other Ambulatory Visit (HOSPITAL_BASED_OUTPATIENT_CLINIC_OR_DEPARTMENT_OTHER): Payer: Self-pay

## 2021-03-29 VITALS — BP 108/68 | HR 113 | Temp 98.9°F | Ht 65.0 in | Wt 159.0 lb

## 2021-03-29 DIAGNOSIS — E0590 Thyrotoxicosis, unspecified without thyrotoxic crisis or storm: Secondary | ICD-10-CM | POA: Diagnosis not present

## 2021-03-29 DIAGNOSIS — Z0000 Encounter for general adult medical examination without abnormal findings: Secondary | ICD-10-CM | POA: Diagnosis not present

## 2021-03-29 MED ORDER — METHIMAZOLE 10 MG PO TABS
ORAL_TABLET | Freq: Every day | ORAL | 3 refills | Status: DC
  Filled 2021-03-29: qty 30, 30d supply, fill #0

## 2021-03-29 MED ORDER — ATENOLOL 100 MG PO TABS
100.0000 mg | ORAL_TABLET | Freq: Every day | ORAL | 3 refills | Status: DC
  Filled 2021-03-29: qty 90, 90d supply, fill #0
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
## 2021-03-29 NOTE — Patient Instructions (Addendum)
Give [place] 2-3 business days to get the results of your labs back once you get the drawn.  Keep the diet clean and stay active.  Aim to do some physical exertion for 150 minutes per week. This is typically divided into 5 days per week, 30 minutes per day. The activity should be enough to get your heart rate up. Anything is better than nothing if you have time constraints.  Let us know if you need anything.

## 2021-04-01 ENCOUNTER — Other Ambulatory Visit: Payer: Self-pay | Admitting: Family Medicine

## 2021-04-01 DIAGNOSIS — Z113 Encounter for screening for infections with a predominantly sexual mode of transmission: Secondary | ICD-10-CM

## 2021-04-01 NOTE — Progress Notes (Signed)
urin

## 2021-04-03 ENCOUNTER — Other Ambulatory Visit: Payer: Self-pay

## 2021-04-03 ENCOUNTER — Other Ambulatory Visit (INDEPENDENT_AMBULATORY_CARE_PROVIDER_SITE_OTHER): Payer: Managed Care, Other (non HMO)

## 2021-04-03 ENCOUNTER — Other Ambulatory Visit (HOSPITAL_COMMUNITY)
Admission: RE | Admit: 2021-04-03 | Discharge: 2021-04-03 | Disposition: A | Discharge status: Home / Self Care | Payer: Managed Care, Other (non HMO) | Source: Ambulatory Visit | Attending: Family Medicine | Admitting: Family Medicine

## 2021-04-03 DIAGNOSIS — Z113 Encounter for screening for infections with a predominantly sexual mode of transmission: Secondary | ICD-10-CM | POA: Diagnosis present

## 2021-04-03 DIAGNOSIS — E0590 Thyrotoxicosis, unspecified without thyrotoxic crisis or storm: Secondary | ICD-10-CM

## 2021-04-03 DIAGNOSIS — Z0000 Encounter for general adult medical examination without abnormal findings: Secondary | ICD-10-CM | POA: Diagnosis not present

## 2021-04-03 LAB — COMPREHENSIVE METABOLIC PANEL
ALT: 20 U/L (ref 0–35)
AST: 15 U/L (ref 0–37)
Albumin: 4.1 g/dL (ref 3.5–5.2)
Alkaline Phosphatase: 167 U/L — ABNORMAL HIGH (ref 39–117)
BUN: 8 mg/dL (ref 6–23)
CO2: 24 mEq/L (ref 19–32)
Calcium: 10 mg/dL (ref 8.4–10.5)
Chloride: 105 mEq/L (ref 96–112)
Creatinine, Ser: 0.34 mg/dL — ABNORMAL LOW (ref 0.40–1.20)
GFR: 136.4 mL/min (ref >60.00)
Glucose, Bld: 102 mg/dL — ABNORMAL HIGH (ref 70–99)
Potassium: 4.1 mEq/L (ref 3.5–5.1)
Sodium: 140 mEq/L (ref 135–145)
Total Bilirubin: 1 mg/dL (ref 0.2–1.2)
Total Protein: 6.9 g/dL (ref 6.0–8.3)

## 2021-04-03 LAB — TSH: TSH: 0 u[IU]/mL — ABNORMAL LOW (ref 0.35–5.50)

## 2021-04-03 LAB — LIPID PANEL
Cholesterol: 97 mg/dL (ref 0–200)
HDL: 45 mg/dL (ref >39.00)
LDL Cholesterol: 42 mg/dL (ref 0–99)
NonHDL: 51.88
Total CHOL/HDL Ratio: 2
Triglycerides: 50 mg/dL (ref 0.0–149.0)
VLDL: 10 mg/dL (ref 0.0–40.0)

## 2021-04-03 LAB — T4, FREE: Free T4: 4.29 ng/dL — ABNORMAL HIGH (ref 0.60–1.60)

## 2021-04-03 LAB — CBC
HCT: 38.1 % (ref 36.0–46.0)
Hemoglobin: 12.7 g/dL (ref 12.0–15.0)
MCHC: 33.2 g/dL (ref 30.0–36.0)
MCV: 76.8 fl — ABNORMAL LOW (ref 78.0–100.0)
Platelets: 307 10*3/uL (ref 150.0–400.0)
RBC: 4.96 Mil/uL (ref 3.87–5.11)
RDW: 13.7 % (ref 11.5–15.5)
WBC: 3.7 10*3/uL — ABNORMAL LOW (ref 4.0–10.5)

## 2021-04-04 LAB — URINE CYTOLOGY ANCILLARY ONLY
Chlamydia: NEGATIVE
Comment: NEGATIVE
Comment: NEGATIVE
Comment: NORMAL
Neisseria Gonorrhea: NEGATIVE
Trichomonas: NEGATIVE

## 2021-04-04 LAB — HIV ANTIBODY (ROUTINE TESTING W REFLEX): HIV 1&2 Ab, 4th Generation: NONREACTIVE

## 2021-04-08 ENCOUNTER — Other Ambulatory Visit (HOSPITAL_BASED_OUTPATIENT_CLINIC_OR_DEPARTMENT_OTHER): Payer: Self-pay

## 2021-05-15 MED ORDER — ATENOLOL 100 MG PO TABS: 100.0000 mg | ORAL_TABLET | Freq: Every day | ORAL | 3 refills | Status: AC

## 2021-05-15 MED ORDER — METHIMAZOLE 10 MG PO TABS: ORAL_TABLET | Freq: Every day | ORAL | 1 refills | Status: DC

## 2021-06-24 ENCOUNTER — Emergency Department (HOSPITAL_BASED_OUTPATIENT_CLINIC_OR_DEPARTMENT_OTHER)
Admission: EM | Admit: 2021-06-24 | Discharge: 2021-06-24 | Disposition: A | Discharge status: Home / Self Care | Payer: Managed Care, Other (non HMO) | Attending: Emergency Medicine | Admitting: Emergency Medicine

## 2021-06-24 ENCOUNTER — Other Ambulatory Visit: Payer: Self-pay

## 2021-06-24 ENCOUNTER — Encounter (HOSPITAL_BASED_OUTPATIENT_CLINIC_OR_DEPARTMENT_OTHER): Payer: Self-pay | Admitting: Emergency Medicine

## 2021-06-24 DIAGNOSIS — J101 Influenza due to other identified influenza virus with other respiratory manifestations: Secondary | ICD-10-CM | POA: Insufficient documentation

## 2021-06-24 DIAGNOSIS — R197 Diarrhea, unspecified: Secondary | ICD-10-CM | POA: Insufficient documentation

## 2021-06-24 DIAGNOSIS — Z20822 Contact with and (suspected) exposure to covid-19: Secondary | ICD-10-CM | POA: Insufficient documentation

## 2021-06-24 LAB — RESP PANEL BY RT-PCR (FLU A&B, COVID) ARPGX2
Influenza A by PCR: POSITIVE — AB
Influenza B by PCR: NEGATIVE
SARS Coronavirus 2 by RT PCR: NEGATIVE

## 2021-06-24 MED ORDER — IBUPROFEN 400 MG PO TABS
600.0000 mg | ORAL_TABLET | Freq: Once | ORAL | Status: AC
  Administered 2021-06-24: 600 mg via ORAL
  Filled 2021-06-24: qty 1

## 2021-06-24 MED ORDER — ACETAMINOPHEN 500 MG PO TABS: 1000.0000 mg | ORAL_TABLET | Freq: Once | ORAL | Status: DC

## 2021-06-24 MED ORDER — ONDANSETRON 8 MG PO TBDP: ORAL_TABLET | ORAL | 0 refills | Status: DC

## 2021-06-24 MED ORDER — IBUPROFEN 800 MG PO TABS: 800.0000 mg | ORAL_TABLET | Freq: Three times a day (TID) | ORAL | 0 refills | Status: DC

## 2021-06-24 MED ORDER — ONDANSETRON 4 MG PO TBDP
8.0000 mg | ORAL_TABLET | Freq: Once | ORAL | Status: AC
  Administered 2021-06-24: 8 mg via ORAL
  Filled 2021-06-24: qty 2

## 2021-06-24 NOTE — ED Triage Notes (Signed)
Pt presents to ED POV. Pt c/o n/v/d, fever, and body aches at home since yesterday morning. Tylenol at 0200 pta.

## 2021-06-24 NOTE — ED Notes (Signed)
Discharge instructions discussed with pt. Pt verbalized understanding. Pt stable and ambulatory. No signature pad available. 
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
## 2022-01-02 ENCOUNTER — Other Ambulatory Visit (HOSPITAL_COMMUNITY)
Admission: RE | Admit: 2022-01-02 | Discharge: 2022-01-02 | Disposition: A | Discharge status: Home / Self Care | Payer: PRIVATE HEALTH INSURANCE | Source: Ambulatory Visit | Attending: Family Medicine | Admitting: Family Medicine

## 2022-01-02 ENCOUNTER — Ambulatory Visit (INDEPENDENT_AMBULATORY_CARE_PROVIDER_SITE_OTHER): Payer: PRIVATE HEALTH INSURANCE | Admitting: Family Medicine

## 2022-01-02 ENCOUNTER — Encounter: Payer: Self-pay | Admitting: Family Medicine

## 2022-01-02 VITALS — BP 130/83 | HR 87 | Resp 20 | Ht 65.0 in | Wt 176.0 lb

## 2022-01-02 DIAGNOSIS — Z113 Encounter for screening for infections with a predominantly sexual mode of transmission: Secondary | ICD-10-CM | POA: Diagnosis not present

## 2022-01-02 DIAGNOSIS — N760 Acute vaginitis: Secondary | ICD-10-CM

## 2022-01-02 DIAGNOSIS — A749 Chlamydial infection, unspecified: Secondary | ICD-10-CM | POA: Diagnosis not present

## 2022-01-02 DIAGNOSIS — B9689 Other specified bacterial agents as the cause of diseases classified elsewhere: Secondary | ICD-10-CM

## 2022-01-02 DIAGNOSIS — B3731 Acute candidiasis of vulva and vagina: Secondary | ICD-10-CM | POA: Diagnosis not present

## 2022-01-02 NOTE — Patient Instructions (Signed)
Vaginal swab and blood work today - we will update you when results are back.  ?Recommend safe sex practices, condoms, etc.  ?

## 2022-01-02 NOTE — Progress Notes (Signed)
? ?  Acute Office Visit ? ?Subjective:  ? ?  ?Patient ID: Paula Huff, female    DOB: Jun 13, 1991, 31 y.o.   MRN: [id number] ? ?Chief Complaint  ?Patient presents with  ? STD Testing  ?  New partner, 3 months ?Clear discharge  ? ? ?HPI ?Patient is in today for STD screening. ? ? ?She has had some clear (no odor) discharge for the past 3-4 days. States she normally doesn't have any discharge at all. She denies any symptoms - no vaginal pain, burning, itching, pelvic pain, urinary changes, fevers, rashes/lesions. She has had a new female partner for the past 3 months. No known exposures. States they occasionally use condoms. She denies any need for oral testing.  ?Contraception = tubal ligation 4 years ago.  ? ? ? ? ?ROS ?All review of systems negative except what is listed in the HPI ? ? ?   ?Objective:  ?  ?BP 130/83 (BP Location: Left Arm, Patient Position: Sitting, Cuff Size: Normal)   Pulse 87   Resp 20   Ht 5\' 5"  (1.651 m)   Wt 176 lb (79.8 kg)   SpO2 99%   BMI 29.29 kg/m?  ? ? ?Physical Exam ?Vitals reviewed.  ?Constitutional:   ?   General: She is not in acute distress. ?   Appearance: Normal appearance. She is not ill-appearing.  ?HENT:  ?   Head: Normocephalic and atraumatic.  ?Abdominal:  ?   General: There is no distension.  ?   Tenderness: There is no abdominal tenderness. There is no guarding.  ?Neurological:  ?   General: No focal deficit present.  ?   Mental Status: She is alert and oriented to person, place, and time. Mental status is at baseline.  ?Psychiatric:     ?   Mood and Affect: Mood normal.     ?   Behavior: Behavior normal.     ?   Thought Content: Thought content normal.     ?   Judgment: Judgment normal.  ? ? ? ? ?No results found for any visits on 01/02/22. ? ? ?   ?Assessment & Plan:  ? ?1. Screen for STD (sexually transmitted disease) ?Asymptomatic.  ?Vaginal swab and blood work today - we will update you when results are back.  ?Recommend safe sex practices, condoms, etc.  ?Patient  aware of signs/symptoms requiring further/urgent evaluation.  ? ?- RPR ?- HIV Antibody (routine testing w rflx) ?- Cervicovaginal ancillary only ? ? ?Return if symptoms worsen or fail to improve. ? ?Terrilyn Saver, NP ? ? ?

## 2022-01-03 LAB — CERVICOVAGINAL ANCILLARY ONLY
Bacterial Vaginitis (gardnerella): POSITIVE — AB
Candida Glabrata: NEGATIVE
Candida Vaginitis: POSITIVE — AB
Chlamydia: POSITIVE — AB
Comment: NEGATIVE
Comment: NEGATIVE
Comment: NEGATIVE
Comment: NEGATIVE
Comment: NEGATIVE
Comment: NORMAL
Neisseria Gonorrhea: NEGATIVE
Trichomonas: NEGATIVE

## 2022-01-03 LAB — HIV ANTIBODY (ROUTINE TESTING W REFLEX): HIV 1&2 Ab, 4th Generation: NONREACTIVE

## 2022-01-03 LAB — RPR: RPR Ser Ql: NONREACTIVE

## 2022-01-06 ENCOUNTER — Encounter: Payer: Self-pay | Admitting: Family Medicine

## 2022-01-06 MED ORDER — FLUCONAZOLE 150 MG PO TABS: 150.0000 mg | ORAL_TABLET | Freq: Once | ORAL | 1 refills | Status: AC

## 2022-01-06 MED ORDER — METRONIDAZOLE 0.75 % VA GEL: 1.0000 | Freq: Every day | VAGINAL | 0 refills | Status: AC

## 2022-01-06 MED ORDER — DOXYCYCLINE HYCLATE 100 MG PO TABS: 100.0000 mg | ORAL_TABLET | Freq: Two times a day (BID) | ORAL | 0 refills | Status: AC

## 2022-01-06 NOTE — Addendum Note (Signed)
Addended by: Hyman Hopes B on: 01/06/2022 10:15 AM ? ? Modules accepted: Orders ? ?

## 2022-01-17 ENCOUNTER — Emergency Department (HOSPITAL_BASED_OUTPATIENT_CLINIC_OR_DEPARTMENT_OTHER)
Admission: EM | Admit: 2022-01-17 | Discharge: 2022-01-17 | Disposition: A | Discharge status: Home / Self Care | Payer: PRIVATE HEALTH INSURANCE | Attending: Emergency Medicine | Admitting: Emergency Medicine

## 2022-01-17 ENCOUNTER — Encounter (HOSPITAL_BASED_OUTPATIENT_CLINIC_OR_DEPARTMENT_OTHER): Payer: Self-pay | Admitting: Emergency Medicine

## 2022-01-17 DIAGNOSIS — R519 Headache, unspecified: Secondary | ICD-10-CM | POA: Insufficient documentation

## 2022-01-17 DIAGNOSIS — R110 Nausea: Secondary | ICD-10-CM | POA: Insufficient documentation

## 2022-01-17 MED ORDER — KETOROLAC TROMETHAMINE 15 MG/ML IJ SOLN
15.0000 mg | Freq: Once | INTRAMUSCULAR | Status: AC
  Administered 2022-01-17: 15 mg via INTRAVENOUS
  Filled 2022-01-17: qty 1

## 2022-01-17 MED ORDER — PROCHLORPERAZINE EDISYLATE 10 MG/2ML IJ SOLN
10.0000 mg | Freq: Once | INTRAMUSCULAR | Status: AC
  Administered 2022-01-17: 10 mg via INTRAVENOUS
  Filled 2022-01-17: qty 2

## 2022-01-17 MED ORDER — DIPHENHYDRAMINE HCL 50 MG/ML IJ SOLN
25.0000 mg | Freq: Once | INTRAMUSCULAR | Status: AC
  Administered 2022-01-17: 25 mg via INTRAVENOUS
  Filled 2022-01-17: qty 1

## 2022-01-17 NOTE — Discharge Instructions (Addendum)

## 2022-01-17 NOTE — ED Triage Notes (Signed)
Headache since Tuesday with nausea and dry heaves. Is on the right has Hx of same. Is not currently on meds.

## 2022-01-17 NOTE — ED Provider Notes (Signed)
[facility] EMERGENCY DEPARTMENT Provider Note   CSN: [id number] Arrival date & time: 01/17/22  0556     History  Chief Complaint  Patient presents with   Headache    Paula Huff is a 31 y.o. female.  The history is provided by the patient.  Headache Quality:  Sharp Onset quality:  Gradual Duration:  2 days Timing:  Constant Progression:  Worsening Chronicity:  New Similar to prior headaches: yes   Relieved by:  Nothing Associated symptoms: nausea   Associated symptoms: no fever, no focal weakness, no numbness, no visual change, no vomiting and no weakness   Patient reports gradual onset of right-sided headache over 2 days ago.  She reports nausea and dry heaving.  No fevers.  No focal weakness.  No new visual changes.  Similar to prior headaches in the past    Home Medications Prior to Admission medications   Medication Sig Start Date End Date Taking? Authorizing Provider  atenolol (TENORMIN) 100 MG tablet Take 1 tablet (100 mg total) by mouth daily. 05/15/21   Sharlene Dory, DO  ibuprofen (ADVIL) 800 MG tablet Take 1 tablet (800 mg total) by mouth 3 (three) times daily. 06/24/21   Palumbo, April, MD  methimazole (TAPAZOLE) 10 MG tablet TAKE 1 TABLET (10 MG TOTAL) BY MOUTH DAILY. 05/15/21 05/15/22  Sharlene Dory, DO  ondansetron (ZOFRAN ODT) 8 MG disintegrating tablet 8mg  ODT q8 hours prn nausea 06/24/21   Palumbo, April, MD      Allergies    Patient has no known allergies.    Review of Systems   Review of Systems  Constitutional:  Negative for fever.  Eyes:  Negative for discharge.  Gastrointestinal:  Positive for nausea. Negative for vomiting.  Neurological:  Positive for headaches. Negative for focal weakness, weakness and numbness.   Physical Exam Updated Vital Signs BP 120/84 (BP Location: Right Arm)   Pulse 72   Temp 98.2 F (36.8 C) (Oral)   Resp 18   Ht 1.651 m (5\' 5" )   Wt 79.4 kg   LMP 01/12/2022 (Exact Date)   SpO2  98%   BMI 29.12 kg/m  Physical Exam CONSTITUTIONAL: Well developed/well nourished, appears uncomfortable holding her head HEAD: Normocephalic/atraumatic, no erythema or rash noted to her forehead EYES: EOMI/PERRL, no nystagmus, no ptosis ENMT: Mucous membranes moist NECK: supple no meningeal signs, no bruits SPINE/BACK:entire spine nontender CV: S1/S2 noted, no murmurs/rubs/gallops noted LUNGS: Lungs are clear to auscultation bilaterally, no apparent distress ABDOMEN: soft, nontender, no rebound or guarding GU:no cva tenderness NEURO:Awake/alert, face symmetric, no arm or leg drift is noted Equal 5/5 strength with shoulder abduction, elbow flex/extension, wrist flex/extension in upper extremities and equal hand grips bilaterally Equal 5/5 strength with hip flexion,knee flex/extension, foot dorsi/plantar flexion Cranial nerves 3/4/5/6/03/09/09/11/12 tested and intact Gait normal without ataxia No past pointing Sensation to light touch intact in all extremities EXTREMITIES: pulses normal, full ROM SKIN: warm, color normal PSYCH: no abnormalities of mood noted, alert and oriented to situation  ED Results / Procedures / Treatments   Labs (all labs ordered are listed, but only abnormal results are displayed) Labs Reviewed - No data to display  EKG None  Radiology No results found.  Procedures Procedures    Medications Ordered in ED Medications  prochlorperazine (COMPAZINE) injection 10 mg (10 mg Intravenous Given 01/17/22 0629)  diphenhydrAMINE (BENADRYL) injection 25 mg (25 mg Intravenous Given 01/17/22 0621)  ketorolac (TORADOL) 15 MG/ML injection 15 mg (15 mg Intravenous  Given 01/17/22 2706)    ED Course/ Medical Decision Making/ A&P Clinical Course as of 01/17/22 0649  Fri Jan 17, 2022  2376 Patient resting comfortably, feeling improved.  Patient is safe for discharge home.  She can follow-up as an outpatient with her primary doctor for further work-up.  However at this  time no signs of acute neurologic emergency [DW]    Clinical Course User Index [DW] Zadie Rhine, MD                           Medical Decision Making Risk Prescription drug management.   This patient presents to the ED for concern of headache, this involves an extensive number of treatment options, and is a complaint that carries with it a high risk of complications and morbidity.  The differential diagnosis includes but is not limited to subarachnoid hemorrhage, intracranial hemorrhage, meningitis, encephalitis, CVST, idiopathic intracranial hypertension, migraine   Comorbidities that complicate the patient evaluation: Patient's presentation is complicated by their history of thyroid disease  Additional history obtained: Records reviewed Primary Care Documents   Medicines ordered and prescription drug management: I ordered medication including Compazine/Benadryl/Toradol for headache Reevaluation of the patient after these medicines showed that the patient    improved  Test Considered: Consider neuroimaging, but patient is well-appearing, no neurodeficits and ambulatory will defer for now  Reevaluation: After the interventions noted above, I reevaluated the patient and found that they have :improved  Complexity of problems addressed: Patient's presentation is most consistent with  acute, uncomplicated illness  Disposition: After consideration of the diagnostic results and the patient's response to treatment,  I feel that the patent would benefit from discharge   .            Final Clinical Impression(s) / ED Diagnoses Final diagnoses:  Headache disorder    Rx / DC Orders ED Discharge Orders     None         Zadie Rhine, MD 01/17/22 [phone]

## 2022-01-17 NOTE — ED Provider Notes (Signed)
I see the patient in signout from Dr. Christy Gentles, briefly the patient is a 31 year old female with a chief complaint of a headache.  Plan to reassess.  On reassessment the patient is feeling a bit better and would like to go home.  PCP follow-up.   Deno Etienne, DO 01/17/22 [phone]

## 2022-04-01 ENCOUNTER — Other Ambulatory Visit (HOSPITAL_COMMUNITY)
Admission: RE | Admit: 2022-04-01 | Discharge: 2022-04-01 | Disposition: A | Discharge status: Home / Self Care | Payer: PRIVATE HEALTH INSURANCE | Source: Ambulatory Visit | Attending: Family Medicine | Admitting: Family Medicine

## 2022-04-01 ENCOUNTER — Encounter: Payer: Self-pay | Admitting: Family Medicine

## 2022-04-01 ENCOUNTER — Ambulatory Visit (INDEPENDENT_AMBULATORY_CARE_PROVIDER_SITE_OTHER): Payer: PRIVATE HEALTH INSURANCE | Admitting: Family Medicine

## 2022-04-01 VITALS — BP 121/79 | HR 74 | Temp 98.5°F | Ht 65.0 in | Wt 176.4 lb

## 2022-04-01 DIAGNOSIS — Z114 Encounter for screening for human immunodeficiency virus [HIV]: Secondary | ICD-10-CM | POA: Diagnosis not present

## 2022-04-01 DIAGNOSIS — Z113 Encounter for screening for infections with a predominantly sexual mode of transmission: Secondary | ICD-10-CM

## 2022-04-01 DIAGNOSIS — Z0000 Encounter for general adult medical examination without abnormal findings: Secondary | ICD-10-CM | POA: Diagnosis not present

## 2022-04-01 LAB — CBC
HCT: 37.9 % (ref 36.0–46.0)
Hemoglobin: 12.6 g/dL (ref 12.0–15.0)
MCHC: 33.3 g/dL (ref 30.0–36.0)
MCV: 82.3 fl (ref 78.0–100.0)
Platelets: 373 10*3/uL (ref 150.0–400.0)
RBC: 4.6 Mil/uL (ref 3.87–5.11)
RDW: 14.1 % (ref 11.5–15.5)
WBC: 5.6 10*3/uL (ref 4.0–10.5)

## 2022-04-01 LAB — COMPREHENSIVE METABOLIC PANEL
ALT: 15 U/L (ref 0–35)
AST: 12 U/L (ref 0–37)
Albumin: 4.5 g/dL (ref 3.5–5.2)
Alkaline Phosphatase: 110 U/L (ref 39–117)
BUN: 10 mg/dL (ref 6–23)
CO2: 24 mEq/L (ref 19–32)
Calcium: 9.5 mg/dL (ref 8.4–10.5)
Chloride: 105 mEq/L (ref 96–112)
Creatinine, Ser: 0.58 mg/dL (ref 0.40–1.20)
GFR: 119.09 mL/min (ref >60.00)
Glucose, Bld: 85 mg/dL (ref 70–99)
Potassium: 4.1 mEq/L (ref 3.5–5.1)
Sodium: 137 mEq/L (ref 135–145)
Total Bilirubin: 0.7 mg/dL (ref 0.2–1.2)
Total Protein: 7.4 g/dL (ref 6.0–8.3)

## 2022-04-01 LAB — LIPID PANEL
Cholesterol: 105 mg/dL (ref 0–200)
HDL: 46.6 mg/dL (ref >39.00)
LDL Cholesterol: 49 mg/dL (ref 0–99)
NonHDL: 58.04
Total CHOL/HDL Ratio: 2
Triglycerides: 43 mg/dL (ref 0.0–149.0)
VLDL: 8.6 mg/dL (ref 0.0–40.0)
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
## 2022-04-01 NOTE — Patient Instructions (Addendum)
Give us 2-3 business days to get the results of your labs back.   Keep the diet clean and stay active.  Please get me a copy of your advanced directive form at your convenience.   I recommend getting the flu shot in mid October. This suggestion would change if the CDC comes out with a different recommendation.   Let us know if you need anything.  

## 2022-04-01 NOTE — Addendum Note (Signed)
Addended by: Scharlene Gloss B on: 04/01/2022 01:00 PM   Modules accepted: Orders

## 2022-04-02 ENCOUNTER — Other Ambulatory Visit: Payer: Self-pay | Admitting: Family Medicine

## 2022-04-02 ENCOUNTER — Other Ambulatory Visit (HOSPITAL_BASED_OUTPATIENT_CLINIC_OR_DEPARTMENT_OTHER): Payer: Self-pay

## 2022-04-02 LAB — CERVICOVAGINAL ANCILLARY ONLY
Bacterial Vaginitis (gardnerella): NEGATIVE
Candida Glabrata: NEGATIVE
Candida Vaginitis: POSITIVE — AB
Chlamydia: NEGATIVE
Comment: NEGATIVE
Comment: NEGATIVE
Comment: NEGATIVE
Comment: NEGATIVE
Comment: NEGATIVE
Comment: NORMAL
Neisseria Gonorrhea: NEGATIVE
Trichomonas: NEGATIVE

## 2022-04-02 LAB — HIV ANTIBODY (ROUTINE TESTING W REFLEX): HIV 1&2 Ab, 4th Generation: NONREACTIVE

## 2022-04-02 MED ORDER — FLUCONAZOLE 150 MG PO TABS
ORAL_TABLET | ORAL | 0 refills | Status: DC
  Filled 2022-04-02: qty 2, 4d supply, fill #0

## 2022-04-03 ENCOUNTER — Other Ambulatory Visit (HOSPITAL_BASED_OUTPATIENT_CLINIC_OR_DEPARTMENT_OTHER): Payer: Self-pay

## 2022-05-26 ENCOUNTER — Encounter: Payer: Self-pay | Admitting: Advanced Practice Midwife

## 2022-05-26 ENCOUNTER — Other Ambulatory Visit (HOSPITAL_COMMUNITY)
Admission: RE | Admit: 2022-05-26 | Discharge: 2022-05-26 | Disposition: A | Discharge status: Home / Self Care | Payer: PRIVATE HEALTH INSURANCE | Source: Ambulatory Visit | Attending: Advanced Practice Midwife | Admitting: Advanced Practice Midwife

## 2022-05-26 ENCOUNTER — Ambulatory Visit (INDEPENDENT_AMBULATORY_CARE_PROVIDER_SITE_OTHER): Payer: PRIVATE HEALTH INSURANCE | Admitting: Advanced Practice Midwife

## 2022-05-26 VITALS — BP 138/71 | HR 91 | Ht 65.0 in | Wt 176.8 lb

## 2022-05-26 DIAGNOSIS — L659 Nonscarring hair loss, unspecified: Secondary | ICD-10-CM | POA: Diagnosis not present

## 2022-05-26 DIAGNOSIS — E0590 Thyrotoxicosis, unspecified without thyrotoxic crisis or storm: Secondary | ICD-10-CM | POA: Diagnosis not present

## 2022-05-26 DIAGNOSIS — Z113 Encounter for screening for infections with a predominantly sexual mode of transmission: Secondary | ICD-10-CM

## 2022-05-26 DIAGNOSIS — Z124 Encounter for screening for malignant neoplasm of cervix: Secondary | ICD-10-CM | POA: Diagnosis not present

## 2022-05-26 DIAGNOSIS — N814 Uterovaginal prolapse, unspecified: Secondary | ICD-10-CM

## 2022-05-26 DIAGNOSIS — Z8751 Personal history of pre-term labor: Secondary | ICD-10-CM

## 2022-05-26 NOTE — Progress Notes (Signed)
Subjective:     Paula Huff is a 31 y.o. female here at [facility] for a routine exam.  Current complaints: pelvic/vaginal pain with prolapse, seen in ED and told she had a prolapse, pt has to "push things back in" when they come out; hyperthyroid on meds x 1 year, no labs for several months by PCP and is having hair loss/thinning hair.  Personal health questionnaire reviewed: yes.  Do you have a primary care provider? yes Do you feel safe at home? yes  Flowsheet Row Office Visit from 04/01/2022 in [facility] Total Score 0       Health Maintenance Due  Topic Date Due   PAP SMEAR-Modifier  12/13/2019   INFLUENZA VACCINE  04/01/2022     Risk factors for chronic health problems: Smoking: Alchohol/how much: Pt BMI: Body mass index is 29.42 kg/m.   Gynecologic History Patient's last menstrual period was 05/03/2022. Contraception: tubal ligation Last Pap: 12/12/2016. Results were: normal Last mammogram: n/a.   Obstetric History OB History  Gravida Para Term Preterm AB Living  3 3 1 2  0 3  SAB IAB Ectopic Multiple Live Births  0 0 0 0 3    # Outcome Date GA Lbr Len/2nd Weight Sex Delivery Anes PTL Lv  3 Term 07/16/17 [redacted]w[redacted]d 10:23 / 00:06 6 lb 13.9 oz (3.115 kg) F Vag-Spont EPI  LIV     Birth Comments: WNL  2 Preterm 2016 [redacted]w[redacted]d   F Vag-Spont  Y LIV  1 Preterm 2009 [redacted]w[redacted]d   M Vag-Spont  Y LIV     The following portions of the patient's history were reviewed and updated as appropriate: allergies, current medications, past family history, past medical history, past social history, past surgical history, and problem list.  Review of Systems Pertinent items noted in HPI and remainder of comprehensive ROS otherwise negative.    Objective:   BP 138/71   Pulse 91   Ht 5\' 5"  (1.651 m)   Wt 176 lb 12.8 oz (80.2 kg)   LMP 05/03/2022   BMI 29.42 kg/m  VS reviewed, nursing note reviewed,  Constitutional: well developed, well  nourished, no distress HEENT: normocephalic CV: normal rate Pulm/chest wall: normal effort Breast Exam:  Deferred with low risks and shared decision making, discussed recommendation to start mammogram between 40-50 yo/ Abdomen: soft Neuro: alert and oriented x 3 Skin: warm, dry Psych: affect normal Pelvic exam:Performed: Cervix pink, visually closed, without lesion, scant white creamy discharge, cystocele with anterior vaginal wall prolapse slightly past introitus with Valsalva  Bimanual exam: Cervix 0/long/high, firm, anterior, neg CMT, uterus nontender, nonenlarged, adnexa without tenderness, enlargement, or mass       Assessment/Plan:   1. Hyperthyroidism  - TSH - T3, free - T4, free  2. Thinning hair --Discussed natural remedies, collagen, DHA  - TSH - T3, free - T4, free  3. Encounter for screening for cervical cancer  - Cytology - PAP( [place])  4. Routine screening for STI (sexually transmitted infection)  - Cytology - PAP( [place]) - Cervicovaginal ancillary only( [place]) - Hepatitis C Antibody - Hepatitis B Surface AntiGEN - RPR  5. Cystocele with prolapse --Discussed pelvic floor exercises, given severity of symptoms for pt, refer to urogyn, pt to f/u with if needs referral for pelvic floor PT which was discussed today.  - Ambulatory referral to Urogynecology     Return in about 1 year (around 05/27/2023)  for annual exam.   Fatima Blank, CNM 9:02 AM

## 2022-05-26 NOTE — Progress Notes (Signed)
Pt presents for AEX. Pt states she was seen in the ED last years and was diagnosed with a uterine or cervical prolapse and she has concerns about that. Pt states it bothers her sometimes and she has just been pushing it back in place. No issues with abnormal periods.

## 2022-05-27 ENCOUNTER — Encounter: Payer: Self-pay | Admitting: Advanced Practice Midwife

## 2022-05-27 LAB — CYTOLOGY - PAP
Adequacy: ABSENT
Comment: NEGATIVE
Diagnosis: NEGATIVE
High risk HPV: NEGATIVE

## 2022-05-27 LAB — CERVICOVAGINAL ANCILLARY ONLY
Chlamydia: NEGATIVE
Comment: NEGATIVE
Comment: NEGATIVE
Comment: NORMAL
Neisseria Gonorrhea: NEGATIVE
Trichomonas: NEGATIVE

## 2022-05-27 LAB — T3, FREE: T3, Free: 6.8 pg/mL — ABNORMAL HIGH (ref 2.0–4.4)

## 2022-05-27 LAB — T4, FREE: Free T4: 3.05 ng/dL — ABNORMAL HIGH (ref 0.82–1.77)

## 2022-05-27 LAB — HEPATITIS C ANTIBODY: Hep C Virus Ab: NONREACTIVE

## 2022-05-27 LAB — RPR: RPR Ser Ql: NONREACTIVE

## 2022-05-27 LAB — HEPATITIS B SURFACE ANTIGEN: Hepatitis B Surface Ag: NEGATIVE

## 2022-05-27 LAB — TSH: TSH: <0.005 u[IU]/mL — ABNORMAL LOW (ref 0.450–4.500)

## 2022-10-03 ENCOUNTER — Encounter: Payer: Self-pay | Admitting: Obstetrics and Gynecology

## 2022-10-03 ENCOUNTER — Ambulatory Visit: Payer: PRIVATE HEALTH INSURANCE | Admitting: Obstetrics and Gynecology

## 2022-10-03 VITALS — BP 116/79 | HR 92 | Ht 65.0 in | Wt 180.0 lb

## 2022-10-03 DIAGNOSIS — N812 Incomplete uterovaginal prolapse: Secondary | ICD-10-CM | POA: Diagnosis not present

## 2022-10-03 DIAGNOSIS — N393 Stress incontinence (female) (male): Secondary | ICD-10-CM | POA: Diagnosis not present

## 2022-10-03 DIAGNOSIS — N816 Rectocele: Secondary | ICD-10-CM

## 2022-10-03 DIAGNOSIS — N8110 Cystocele, unspecified: Secondary | ICD-10-CM

## 2022-10-03 DIAGNOSIS — R350 Frequency of micturition: Secondary | ICD-10-CM

## 2022-10-03 LAB — POCT URINALYSIS DIPSTICK
Bilirubin, UA: NEGATIVE
Blood, UA: NEGATIVE
Glucose, UA: NEGATIVE
Leukocytes, UA: NEGATIVE
Nitrite, UA: NEGATIVE
Protein, UA: POSITIVE — AB
Spec Grav, UA: >=1.03 — AB (ref 1.010–1.025)
Urobilinogen, UA: 1 E.U./dL
pH, UA: 6.5 (ref 5.0–8.0)
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
## 2022-10-20 ENCOUNTER — Encounter: Payer: Self-pay | Admitting: *Deleted

## 2022-10-30 ENCOUNTER — Encounter: Payer: Self-pay | Admitting: Family Medicine

## 2022-10-30 DIAGNOSIS — E0590 Thyrotoxicosis, unspecified without thyrotoxic crisis or storm: Secondary | ICD-10-CM

## 2022-11-07 NOTE — Telephone Encounter (Signed)
Please see below.

## 2023-04-02 ENCOUNTER — Encounter: Payer: Self-pay | Admitting: Family Medicine

## 2023-04-03 ENCOUNTER — Encounter: Payer: PRIVATE HEALTH INSURANCE | Admitting: Family Medicine

## 2024-09-12 ENCOUNTER — Encounter: Payer: Self-pay | Admitting: *Deleted
